# Patient Record
Sex: Male | Born: 1935 | Race: White | Hispanic: No | Marital: Married | Smoking: Former smoker
Health system: Southern US, Community
[De-identification: ages and names within clinical notes are randomized; demographics above are authoritative.]

## PROBLEM LIST (undated history)

## (undated) DIAGNOSIS — J439 Emphysema, unspecified: Secondary | ICD-10-CM

## (undated) DIAGNOSIS — A419 Sepsis, unspecified organism: Secondary | ICD-10-CM

## (undated) DIAGNOSIS — I4891 Unspecified atrial fibrillation: Secondary | ICD-10-CM

## (undated) DIAGNOSIS — N390 Urinary tract infection, site not specified: Secondary | ICD-10-CM

## (undated) DIAGNOSIS — C61 Malignant neoplasm of prostate: Secondary | ICD-10-CM

## (undated) HISTORY — DX: Urinary tract infection, site not specified: N39.0

## (undated) HISTORY — DX: Unspecified atrial fibrillation: I48.91

## (undated) HISTORY — DX: Urinary tract infection, site not specified: A41.9

## (undated) HISTORY — DX: Malignant neoplasm of prostate: C61

---

## 1999-04-18 HISTORY — PX: ABDOMINAL AORTIC ANEURYSM REPAIR: SUR1152

## 1999-04-18 HISTORY — PX: PROSTATECTOMY: SHX69

## 1999-11-17 ENCOUNTER — Encounter (INDEPENDENT_AMBULATORY_CARE_PROVIDER_SITE_OTHER): Payer: Self-pay | Admitting: Specialist

## 1999-11-17 ENCOUNTER — Other Ambulatory Visit: Admission: RE | Admit: 1999-11-17 | Discharge: 1999-11-17 | Payer: Self-pay | Admitting: Urology

## 1999-12-02 ENCOUNTER — Encounter: Admission: RE | Admit: 1999-12-02 | Discharge: 1999-12-02 | Payer: Self-pay | Admitting: Urology

## 1999-12-02 ENCOUNTER — Encounter: Payer: Self-pay | Admitting: Urology

## 2000-01-23 ENCOUNTER — Inpatient Hospital Stay (HOSPITAL_COMMUNITY): Admission: RE | Admit: 2000-01-23 | Discharge: 2000-01-26 | Payer: Self-pay | Admitting: Urology

## 2000-01-23 ENCOUNTER — Encounter (INDEPENDENT_AMBULATORY_CARE_PROVIDER_SITE_OTHER): Payer: Self-pay

## 2000-03-05 ENCOUNTER — Encounter: Admission: RE | Admit: 2000-03-05 | Discharge: 2000-03-05 | Payer: Self-pay | Admitting: Gastroenterology

## 2000-03-05 ENCOUNTER — Encounter: Payer: Self-pay | Admitting: *Deleted

## 2000-03-13 ENCOUNTER — Encounter: Payer: Self-pay | Admitting: *Deleted

## 2000-03-15 ENCOUNTER — Ambulatory Visit (HOSPITAL_COMMUNITY): Admission: RE | Admit: 2000-03-15 | Discharge: 2000-03-15 | Payer: Self-pay | Admitting: *Deleted

## 2000-04-18 ENCOUNTER — Encounter: Payer: Self-pay | Admitting: *Deleted

## 2000-04-20 ENCOUNTER — Encounter: Payer: Self-pay | Admitting: *Deleted

## 2000-04-20 ENCOUNTER — Inpatient Hospital Stay (HOSPITAL_COMMUNITY): Admission: RE | Admit: 2000-04-20 | Discharge: 2000-04-28 | Payer: Self-pay | Admitting: *Deleted

## 2000-04-20 ENCOUNTER — Encounter (INDEPENDENT_AMBULATORY_CARE_PROVIDER_SITE_OTHER): Payer: Self-pay | Admitting: Specialist

## 2002-04-14 ENCOUNTER — Encounter: Payer: Self-pay | Admitting: Surgery

## 2002-04-18 ENCOUNTER — Observation Stay (HOSPITAL_COMMUNITY): Admission: RE | Admit: 2002-04-18 | Discharge: 2002-04-19 | Payer: Self-pay | Admitting: Surgery

## 2003-02-09 ENCOUNTER — Encounter: Payer: Self-pay | Admitting: Gastroenterology

## 2003-02-09 ENCOUNTER — Ambulatory Visit (HOSPITAL_COMMUNITY): Admission: RE | Admit: 2003-02-09 | Discharge: 2003-02-09 | Payer: Self-pay | Admitting: Gastroenterology

## 2003-04-18 HISTORY — PX: COLECTOMY: SHX59

## 2003-04-18 HISTORY — PX: OTHER SURGICAL HISTORY: SHX169

## 2003-04-22 ENCOUNTER — Ambulatory Visit (HOSPITAL_COMMUNITY): Admission: RE | Admit: 2003-04-22 | Discharge: 2003-04-22 | Payer: Self-pay | Admitting: Surgery

## 2003-04-24 ENCOUNTER — Ambulatory Visit (HOSPITAL_COMMUNITY): Admission: RE | Admit: 2003-04-24 | Discharge: 2003-04-24 | Payer: Self-pay | Admitting: Surgery

## 2003-05-05 ENCOUNTER — Inpatient Hospital Stay (HOSPITAL_COMMUNITY): Admission: EM | Admit: 2003-05-05 | Discharge: 2003-05-19 | Payer: Self-pay | Admitting: Emergency Medicine

## 2003-05-07 ENCOUNTER — Encounter (INDEPENDENT_AMBULATORY_CARE_PROVIDER_SITE_OTHER): Payer: Self-pay | Admitting: *Deleted

## 2003-11-16 ENCOUNTER — Encounter: Admission: RE | Admit: 2003-11-16 | Discharge: 2003-11-16 | Payer: Self-pay | Admitting: General Surgery

## 2004-01-20 ENCOUNTER — Inpatient Hospital Stay (HOSPITAL_COMMUNITY): Admission: RE | Admit: 2004-01-20 | Discharge: 2004-01-27 | Payer: Self-pay | Admitting: General Surgery

## 2004-08-03 ENCOUNTER — Emergency Department (HOSPITAL_COMMUNITY): Admission: EM | Admit: 2004-08-03 | Discharge: 2004-08-04 | Payer: Self-pay | Admitting: Emergency Medicine

## 2007-01-03 ENCOUNTER — Ambulatory Visit: Payer: Self-pay | Admitting: Internal Medicine

## 2007-01-16 ENCOUNTER — Ambulatory Visit: Payer: Self-pay | Admitting: Internal Medicine

## 2007-01-16 ENCOUNTER — Encounter: Payer: Self-pay | Admitting: Internal Medicine

## 2007-08-28 ENCOUNTER — Telehealth: Payer: Self-pay | Admitting: Internal Medicine

## 2007-08-28 ENCOUNTER — Emergency Department (HOSPITAL_COMMUNITY): Admission: EM | Admit: 2007-08-28 | Discharge: 2007-08-28 | Payer: Self-pay | Admitting: Emergency Medicine

## 2008-11-12 ENCOUNTER — Encounter: Payer: Self-pay | Admitting: Internal Medicine

## 2009-10-06 ENCOUNTER — Ambulatory Visit: Payer: Self-pay | Admitting: Internal Medicine

## 2009-10-06 DIAGNOSIS — M25429 Effusion, unspecified elbow: Secondary | ICD-10-CM | POA: Insufficient documentation

## 2009-10-06 DIAGNOSIS — S59909A Unspecified injury of unspecified elbow, initial encounter: Secondary | ICD-10-CM

## 2009-10-06 DIAGNOSIS — S6990XA Unspecified injury of unspecified wrist, hand and finger(s), initial encounter: Secondary | ICD-10-CM

## 2009-10-06 DIAGNOSIS — S59919A Unspecified injury of unspecified forearm, initial encounter: Secondary | ICD-10-CM

## 2009-10-07 LAB — CONVERTED CEMR LAB
Basophils Relative: 0.5 % (ref 0.0–3.0)
Eosinophils Absolute: 0 10*3/uL (ref 0.0–0.7)
Eosinophils Relative: 0.5 % (ref 0.0–5.0)
Lymphocytes Relative: 22.3 % (ref 12.0–46.0)
Monocytes Relative: 11.8 % (ref 3.0–12.0)
Neutrophils Relative %: 64.9 % (ref 43.0–77.0)
RBC: 4.32 M/uL (ref 4.22–5.81)
WBC: 5.8 10*3/uL (ref 4.5–10.5)

## 2009-12-17 ENCOUNTER — Encounter: Payer: Self-pay | Admitting: Internal Medicine

## 2009-12-24 ENCOUNTER — Encounter: Payer: Self-pay | Admitting: Internal Medicine

## 2010-01-12 ENCOUNTER — Encounter: Payer: Self-pay | Admitting: Internal Medicine

## 2010-02-09 ENCOUNTER — Encounter (INDEPENDENT_AMBULATORY_CARE_PROVIDER_SITE_OTHER): Payer: Self-pay | Admitting: *Deleted

## 2010-02-11 ENCOUNTER — Ambulatory Visit: Payer: Self-pay | Admitting: Internal Medicine

## 2010-02-25 ENCOUNTER — Ambulatory Visit: Payer: Self-pay | Admitting: Internal Medicine

## 2010-02-28 ENCOUNTER — Encounter: Payer: Self-pay | Admitting: Internal Medicine

## 2010-05-17 NOTE — Assessment & Plan Note (Signed)
Summary: cut on arm/infection//kn   Vital Signs:  Patient profile:   77 year old male Weight:      170.0 pounds Temp:     98.4 degrees F oral Pulse rate:   64 / minute Resp:     17 per minute BP sitting:   110 / 70  (left arm) Cuff size:   large  Vitals Entered By: Shonna Chock (October 06, 2009 12:27 PM) CC: Left arm (mainly elbow area) swollen x 1.5 week (s) and tender to touch Comments REVIEWED MED LIST, PATIENT AGREED DOSE AND INSTRUCTION CORRECT    CC:  Left arm (mainly elbow area) swollen x 1.5 week (s) and tender to touch.  History of Present Illness: On 09/27/2009 he noted  puncture wound @ R elbow with bleeding  after cutting down holly bush. Swelling & pain beginning 2 days later.The wound has healed , but "my wife feels it is infected". Rx: none. Tetanus status ? Marland KitchenPMH of OA; no gout  Allergies (verified): No Known Drug Allergies  Review of Systems General:  Denies chills, fever, and sweats. MS:  Denies joint pain. Derm:  Complains of changes in color of skin; elbow "? slightly red & warm @ times".  Physical Exam  General:  in no acute distress; alert,appropriate and cooperative throughout examination Extremities:  Olecranon effusion R elbow with minimal tenderness to pressure Skin:  No wound, erythema, or temp change @ elbow Cervical Nodes:  No lymphadenopathy noted Axillary Nodes:  No palpable lymphadenopathy; no epitrochlear   Impression & Recommendations:  Problem # 1:  ELBOW INJURY (ICD-959.3)  S/P puncture wound, healed. No cellulitis or osteomyelitis clinically  Orders: Venipuncture ([address]) TLB-CBC Platelet - w/Differential (85025-CBCD) T-Elbow Rigt 2 View (73070TC) TLB-Sedimentation Rate (ESR) (85652-ESR)  Problem # 2:  EFFUSION OF UPPER ARM JOINT (ICD-719.02)  ? post traumatic  Orders: Venipuncture ([address]) TLB-CBC Platelet - w/Differential (85025-CBCD) T-Elbow Rigt 2 View (73070TC) TLB-Sedimentation Rate (ESR) (85652-ESR)  Complete  Medication List: 1)  Prilosec Otc 20 Mg Tbec (Omeprazole magnesium) .Marland Kitchen.. 1 by mouth once daily 2)  Stool Softner  .... Daily  Other Orders: Tdap => 50yrs IM ([address]) Admin 1st Vaccine (91478)  Patient Instructions: 1)  Wear  elastic  elbow brace daily until fluid gone. Orthopedic  consult for drainage  if fluid fails to resolve   Immunizations Administered:  Tetanus Vaccine:    Vaccine Type: Tdap    Site: left deltoid    Mfr: GlaxoSmithKline    Dose: 0.5 ml    Route: IM    Given by: Army Fossa CMA    Exp. Date: 07/10/2011    Lot #: [id number]

## 2010-05-17 NOTE — Letter (Signed)
Summary: [facility]  [facility]   Imported By: Lanelle Bal 01/05/2010 10:20:11  _____________________________________________________________________  External Attachment:    Type:   Image     Comment:   External Document

## 2010-05-17 NOTE — Miscellaneous (Signed)
Summary: [facility] previsit  Clinical Lists Changes  Medications: Added new medication of MOVIPREP 100 GM  SOLR (PEG-KCL-NACL-NASULF-NA ASC-C) As per prep instructions. - Signed Rx of MOVIPREP 100 GM  SOLR (PEG-KCL-NACL-NASULF-NA ASC-C) As per prep instructions.;  #1 x 0;  Signed;  Entered by: Karl Bales RN;  Authorized by: Hilarie Fredrickson MD;  Method used: Electronically to [facility] # [id number]*, [address] [place], [place], [place]  [address], Ph: [phone] or [phone], Fax: [phone] Observations: Added new observation of NKA: T (02/11/2010 9:38)    Prescriptions: MOVIPREP 100 GM  SOLR (PEG-KCL-NACL-NASULF-NA ASC-C) As per prep instructions.  #1 x 0   Entered by:   Karl Bales RN   Authorized by:   Hilarie Fredrickson MD   Signed by:   Karl Bales RN on 02/11/2010   Method used:   Electronically to        [facility] [id number]* (retail)       [address]       [place], [place]  [address]       Ph: [phone] or [phone]       Fax: [phone]   RxID:   [id number]

## 2010-05-17 NOTE — Procedures (Signed)
Summary: Colonoscopy  Patient: [name] Note: All result statuses are Final unless otherwise noted.  Tests: (1) Colonoscopy (COL)   COL Colonoscopy           DONE     [facility]     [address]     [place], [place]  [address]           COLONOSCOPY PROCEDURE REPORT           PATIENT:  Derrick [name], Derrick [name]  MR#:  [id number]     BIRTHDATE:  06-08-1928, 81 yrs. old  GENDER:  male     ENDOSCOPIST:  Wilhemina Bonito. Eda Keys, MD     REF. BY:  Surveillance Program Recall,     PROCEDURE DATE:  02/25/2010     PROCEDURE:  Colonoscopy with snare polypectomy X 4     ASA CLASS:  Class II     INDICATIONS:  history of pre-cancerous (adenomatous) colon polyps,     surveillance and high-risk screening ; HX MULTIPLE ADENOMAS. LAST     EXAM W/ SAME 01-2007     MEDICATIONS:   Fentanyl 50 mcg IV, Versed 6 mg IV           DESCRIPTION OF PROCEDURE:   After the risks benefits and     alternatives of the procedure were thoroughly explained, informed     consent was obtained.  Digital rectal exam was performed and     revealed no abnormalities.   The LB 180AL [id number] endoscope was     introduced through the anus and advanced to the cecum, which was     identified by both the appendix and ileocecal valve, without     limitations.Time to cecum = 2:00 min.  The quality of the prep was     good, using MoviPrep.  The instrument was then slowly withdrawn     (time = 13:13 min) as the colon was fully examined.     <<PROCEDUREIMAGES>>           FINDINGS:  There were four, all < 5mm, polyps identified and     removed from the ascending colon (2) and transverse colon (2).     Polyps were snared without cautery. Retrieval was successful.     Moderate diverticulosis was found throughout the colon.     Retroflexed views in the rectum revealed internal hemorrhoids.     The scope was then withdrawn from the patient and the procedure     completed.           COMPLICATIONS:  None     ENDOSCOPIC IMPRESSION:   1) Polyps, multiple - removed     2) Moderate diverticulosis throughout the colon     3) Internal hemorrhoids     RECOMMENDATIONS:     1) Follow up colonoscopy in 3 years if medically fit     ______________________________     Wilhemina Bonito. Eda Keys, MD           CC:  Pecola Lawless, MD;The Patient           n.     Rosalie DoctorWilhemina Bonito. Eda Keys at 02/25/2010 11:56 AM           Cohron, Leonette Most, [id number]  Note: An exclamation mark (!) indicates a result that was not dispersed into the flowsheet. Document Creation Date: 02/25/2010 11:56 AM _______________________________________________________________________  (1) Order result status: Final Collection or observation date-time: 02/25/2010 11:45 Requested date-time:  Receipt date-time:  Reported date-time:  Referring Physician:   Ordering Physician: Fransico Setters [phone]) Specimen Source:  Source: Launa Grill Order Number: [id number] Lab site:   Appended Document: Colonoscopy     Procedures Next Due Date:    Colonoscopy: 02/2013

## 2010-05-17 NOTE — Letter (Signed)
Summary: [facility] Instructions  [facility]  [address] [place], [place] [address]   Phone: [phone]  Fax: [phone]       Derrick [name]    1928-09-16    MRN: [id number]        Procedure Day [name]:  [name]  02/25/10     Arrival Time:   10:00AM     Procedure Time:  11:00AM     Location of Procedure:                    [name] _  [facility] (4th Floor)                       PREPARATION FOR COLONOSCOPY WITH MOVIPREP   Starting 5 days prior to your procedure 02/20/10 do not eat nuts, seeds, popcorn, corn, beans, peas,  salads, or any raw vegetables.  Do not take any fiber supplements (e.g. Metamucil, Citrucel, and Benefiber).  THE DAY BEFORE YOUR PROCEDURE         DATE: 02/24/10   DAY: THURSDAY  1.  Drink clear liquids the entire day-NO SOLID FOOD  2.  Do not drink anything colored red or purple.  Avoid juices with pulp.  No orange juice.  3.  Drink at least 64 oz. (8 glasses) of fluid/clear liquids during the day to prevent dehydration and help the prep work efficiently.  CLEAR LIQUIDS INCLUDE: Water Jello Ice Popsicles Tea (sugar ok, no milk/cream) Powdered fruit flavored drinks Coffee (sugar ok, no milk/cream) Gatorade Juice: apple, white grape, white cranberry  Lemonade Clear bullion, consomm, broth Carbonated beverages (any kind) Strained chicken noodle soup Hard Candy                             4.  In the morning, mix first dose of MoviPrep solution:    Empty 1 Pouch A and 1 Pouch B into the disposable container    Add lukewarm drinking water to the top line of the container. Mix to dissolve    Refrigerate (mixed solution should be used within 24 hrs)  5.  Begin drinking the prep at 5:00 p.m. The MoviPrep container is divided by 4 marks.   Every 15 minutes drink the solution down to the next mark (approximately 8 oz) until the full liter is complete.   6.  Follow completed prep with 16 oz of clear liquid of your choice  (Nothing red or purple).  Continue to drink clear liquids until bedtime.  7.  Before going to bed, mix second dose of MoviPrep solution:    Empty 1 Pouch A and 1 Pouch B into the disposable container    Add lukewarm drinking water to the top line of the container. Mix to dissolve    Refrigerate  THE DAY OF YOUR PROCEDURE      DATE:  02/25/10    DAY:   FRIDAY   Beginning at 6:00AM (5 hours before procedure):         1. Every 15 minutes, drink the solution down to the next mark (approx 8 oz) until the full liter is complete.  2. Follow completed prep with 16 oz. of clear liquid of your choice.    3. You may drink clear liquids until 9:00AM (2 HOURS BEFORE PROCEDURE).   MEDICATION INSTRUCTIONS  Unless otherwise instructed, you should take regular prescription medications with a small sip of water  as early as possible the morning of your procedure.         OTHER INSTRUCTIONS  You will need a responsible adult at least 75 years of age to accompany you and drive you home.   This person must remain in the waiting room during your procedure.  Wear loose fitting clothing that is easily removed.  Leave jewelry and other valuables at home.  However, you may wish to bring a book to read or  an iPod/MP3 player to listen to music as you wait for your procedure to start.  Remove all body piercing jewelry and leave at home.  Total time from sign-in until discharge is approximately 2-3 hours.  You should go home directly after your procedure and rest.  You can resume normal activities the  day after your procedure.  The day of your procedure you should not:   Drive   Make legal decisions   Operate machinery   Drink alcohol   Return to work  You will receive specific instructions about eating, activities and medications before you leave.    The above instructions have been reviewed and explained to me by   Karl Bales RN  February 11, 2010 10:18 AM    I fully  understand and can verbalize these instructions _____________________________ Date _________

## 2010-05-17 NOTE — Letter (Signed)
Summary: Patient Notice- Polyp Results  [facility]  [address] [place], [place] [address]   Phone: [phone]  Fax: [phone]        February 28, 2010 MRN: [id number]    Derrick [name] [address] [place], [place]  [address]    Dear [name],  I am pleased to inform you that the colon polyp(s) removed during your recent colonoscopy was (were) found to be benign (no cancer detected) upon pathologic examination.  I recommend you have a repeat colonoscopy examination in 3 years to look for recurrent polyps, as having colon polyps increases your risk for having recurrent polyps or even colon cancer in the future.  Should you develop new or worsening symptoms of abdominal pain, bowel habit changes or bleeding from the rectum or bowels, please schedule an evaluation with either your primary care physician or with me.  Additional information/recommendations:  __ No further action with gastroenterology is needed at this time. Please      follow-up with your primary care physician for your other healthcare      needs.    Please call us if you are having persistent problems or have questions about your condition that have not been fully answered at this time.  Sincerely,  Hilarie Fredrickson MD  This letter has been electronically signed by your physician.  Appended Document: Patient Notice- Polyp Results Letter mailed

## 2010-05-17 NOTE — Letter (Signed)
Summary: Colonoscopy Letter  [facility]  [address] [place], [place] [address]   Phone: [phone]  Fax: [phone]      December 17, 2009 MRN: [id number]   Derrick [name] [address] [place], [place]  [address]   Dear [name],   According to your medical record, it is time for you to schedule a Colonoscopy. The American Cancer Society recommends this procedure as a method to detect early colon cancer. Patients with a family history of colon cancer, or a personal history of colon polyps or inflammatory bowel disease are at increased risk.  This letter has been generated based on the recommendations made at the time of your procedure. If you feel that in your particular situation this may no longer apply, please contact our office.  Please call our office at [phone] to schedule this appointment or to update your records at your earliest convenience.  Thank you for cooperating with [place] to provide you with the very best care possible.   Sincerely,  Wilhemina Bonito. Marina Goodell, M.D.  [facility] Gastroenterology Division [phone]

## 2010-05-17 NOTE — Letter (Signed)
Summary: Pre Visit Letter Revised  [facility]  [address] [place], [place] [address]   Phone: [phone]  Fax: [phone]        01/12/2010 MRN: [id number] Derrick [name] [address] [place], [place]  [address]             Procedure Date:  11-11 at 11am  Welcome to the Gastroenterology Division at [facility].    You are scheduled to see a nurse for your pre-procedure visit on 02-11-10 at 10am on the 3rd floor at [facility] [address]  We ask that you try to arrive at our office 15 minutes prior to your appointment time to allow for check-in.  Please take a minute to review the attached form.  If you answer "Yes" to one or more of the questions on the first page, we ask that you call the person listed at your earliest opportunity.  If you answer "No" to all of the questions, please complete the rest of the form and bring it to your appointment.    Your nurse visit will consist of discussing your medical and surgical history, your immediate family medical history, and your medications.   If you are unable to list all of your medications on the form, please bring the medication bottles to your appointment and we will list them.  We will need to be aware of both prescribed and over the counter drugs.  We will need to know exact dosage information as well.    Please be prepared to read and sign documents such as consent forms, a financial agreement, and acknowledgement forms.  If necessary, and with your consent, a friend or relative is welcome to sit-in on the nurse visit with you.  Please bring your insurance card so that we may make a copy of it.  If your insurance requires a referral to see a specialist, please bring your referral form from your primary care physician.  No co-pay is required for this nurse visit.     If you cannot keep your appointment, please call [phone] to cancel or reschedule prior to your appointment date.  This  allows [place] the opportunity to schedule an appointment for another patient in need of care.    Thank you for choosing [facility] for your medical needs.  We appreciate the opportunity to care for you.  Please visit [place] at our website  to learn more about our practice.  Sincerely, The Gastroenterology Division

## 2010-07-25 ENCOUNTER — Emergency Department (HOSPITAL_COMMUNITY): Payer: Medicare Other

## 2010-07-25 ENCOUNTER — Emergency Department (HOSPITAL_COMMUNITY)
Admission: EM | Admit: 2010-07-25 | Discharge: 2010-07-26 | Disposition: A | Discharge status: Home / Self Care | Payer: Medicare Other | Attending: Emergency Medicine | Admitting: Emergency Medicine

## 2010-07-25 DIAGNOSIS — N289 Disorder of kidney and ureter, unspecified: Secondary | ICD-10-CM | POA: Insufficient documentation

## 2010-07-25 DIAGNOSIS — N12 Tubulo-interstitial nephritis, not specified as acute or chronic: Secondary | ICD-10-CM | POA: Insufficient documentation

## 2010-07-25 DIAGNOSIS — Z8546 Personal history of malignant neoplasm of prostate: Secondary | ICD-10-CM | POA: Insufficient documentation

## 2010-07-25 DIAGNOSIS — J449 Chronic obstructive pulmonary disease, unspecified: Secondary | ICD-10-CM | POA: Insufficient documentation

## 2010-07-25 DIAGNOSIS — Z9889 Other specified postprocedural states: Secondary | ICD-10-CM | POA: Insufficient documentation

## 2010-07-25 DIAGNOSIS — J4489 Other specified chronic obstructive pulmonary disease: Secondary | ICD-10-CM | POA: Insufficient documentation

## 2010-07-25 DIAGNOSIS — K5900 Constipation, unspecified: Secondary | ICD-10-CM | POA: Insufficient documentation

## 2010-07-25 DIAGNOSIS — Q619 Cystic kidney disease, unspecified: Secondary | ICD-10-CM | POA: Insufficient documentation

## 2010-07-25 DIAGNOSIS — R110 Nausea: Secondary | ICD-10-CM | POA: Insufficient documentation

## 2010-07-25 DIAGNOSIS — R630 Anorexia: Secondary | ICD-10-CM | POA: Insufficient documentation

## 2010-07-25 DIAGNOSIS — R509 Fever, unspecified: Secondary | ICD-10-CM | POA: Insufficient documentation

## 2010-07-25 DIAGNOSIS — R1032 Left lower quadrant pain: Secondary | ICD-10-CM | POA: Insufficient documentation

## 2010-07-25 DIAGNOSIS — R10814 Left lower quadrant abdominal tenderness: Secondary | ICD-10-CM | POA: Insufficient documentation

## 2010-07-25 DIAGNOSIS — I739 Peripheral vascular disease, unspecified: Secondary | ICD-10-CM | POA: Insufficient documentation

## 2010-07-25 LAB — CBC
MCH: 34 pg (ref 26.0–34.0)
MCV: 98.9 fL (ref 78.0–100.0)
Platelets: 114 10*3/uL — ABNORMAL LOW (ref 150–400)
RDW: 12.7 % (ref 11.5–15.5)
WBC: 10 10*3/uL (ref 4.0–10.5)

## 2010-07-25 LAB — COMPREHENSIVE METABOLIC PANEL
Albumin: 4.1 g/dL (ref 3.5–5.2)
BUN: 20 mg/dL (ref 6–23)
Calcium: 9.9 mg/dL (ref 8.4–10.5)
Creatinine, Ser: 2.1 mg/dL — ABNORMAL HIGH (ref 0.4–1.5)
Potassium: 4.5 mEq/L (ref 3.5–5.1)
Total Protein: 7.4 g/dL (ref 6.0–8.3)

## 2010-07-25 LAB — DIFFERENTIAL
Eosinophils Absolute: 0 10*3/uL (ref 0.0–0.7)
Eosinophils Relative: 0 % (ref 0–5)
Lymphs Abs: 0.4 10*3/uL — ABNORMAL LOW (ref 0.7–4.0)

## 2010-07-25 LAB — URINALYSIS, ROUTINE W REFLEX MICROSCOPIC
Glucose, UA: NEGATIVE mg/dL
Protein, ur: >300 mg/dL — AB
Specific Gravity, Urine: 1.018 (ref 1.005–1.030)
pH: 6 (ref 5.0–8.0)

## 2010-07-25 LAB — URINE MICROSCOPIC-ADD ON

## 2010-07-25 MED ORDER — IOHEXOL 300 MG/ML  SOLN
50.0000 mL | Freq: Once | INTRAMUSCULAR | Status: AC | PRN
  Administered 2010-07-25: 50 mL via INTRAVENOUS

## 2010-07-27 LAB — URINE CULTURE: Colony Count: >=100000

## 2010-08-15 ENCOUNTER — Ambulatory Visit (HOSPITAL_BASED_OUTPATIENT_CLINIC_OR_DEPARTMENT_OTHER)
Admission: RE | Admit: 2010-08-15 | Discharge: 2010-08-15 | Disposition: A | Discharge status: Home / Self Care | Payer: Medicare Other | Source: Ambulatory Visit | Attending: Urology | Admitting: Urology

## 2010-08-15 ENCOUNTER — Other Ambulatory Visit: Payer: Self-pay | Admitting: Urology

## 2010-08-15 DIAGNOSIS — Z01810 Encounter for preprocedural cardiovascular examination: Secondary | ICD-10-CM | POA: Insufficient documentation

## 2010-08-15 DIAGNOSIS — I739 Peripheral vascular disease, unspecified: Secondary | ICD-10-CM | POA: Insufficient documentation

## 2010-08-15 DIAGNOSIS — Z01812 Encounter for preprocedural laboratory examination: Secondary | ICD-10-CM | POA: Insufficient documentation

## 2010-08-15 DIAGNOSIS — K219 Gastro-esophageal reflux disease without esophagitis: Secondary | ICD-10-CM | POA: Insufficient documentation

## 2010-08-15 DIAGNOSIS — N289 Disorder of kidney and ureter, unspecified: Secondary | ICD-10-CM | POA: Insufficient documentation

## 2010-08-15 DIAGNOSIS — J438 Other emphysema: Secondary | ICD-10-CM | POA: Insufficient documentation

## 2010-08-15 DIAGNOSIS — Z8546 Personal history of malignant neoplasm of prostate: Secondary | ICD-10-CM | POA: Insufficient documentation

## 2010-08-15 DIAGNOSIS — N189 Chronic kidney disease, unspecified: Secondary | ICD-10-CM | POA: Insufficient documentation

## 2010-08-15 DIAGNOSIS — N135 Crossing vessel and stricture of ureter without hydronephrosis: Secondary | ICD-10-CM | POA: Insufficient documentation

## 2010-08-15 DIAGNOSIS — N1330 Unspecified hydronephrosis: Secondary | ICD-10-CM | POA: Insufficient documentation

## 2010-08-15 NOTE — Op Note (Signed)
NAMEJOHNEDWARD, BRODRICK NO.:  [id number]  MEDICAL RECORD NO.:  [id number]           PATIENT TYPE:  E  LOCATION:  [facility]                         FACILITY:  [facility]  PHYSICIAN:  Valetta Fuller, M.D.  DATE OF BIRTH:  1929-04-01  DATE OF PROCEDURE: DATE OF DISCHARGE:  07/26/2010                              OPERATIVE REPORT   PREOPERATIVE DIAGNOSES: 1. A 2.5 centimeter filling defect left renal pelvis, possible     transitional cell carcinoma. 2. History of adenocarcinoma of the prostate. 3. Mild hydronephrosis.  POSTOPERATIVE DIAGNOSES: 1. A 2.5 centimeter filling defect left renal pelvis, possible     transitional cell carcinoma. 2. History of adenocarcinoma of the prostate. 3. Mild hydronephrosis.  PROCEDURE PERFORMED:  Cystoscopy, left retrograde pyelogram, left ureteral balloon dilation, left ureteroscopy, left renal pelvic washings for cytology, left double-J stent placement.  SURGEON:  Valetta Fuller, M.D.  ANESTHESIA:  General.  INDICATIONS:  Mr. Honor is 77 years of age.  He is 10 years status post radical retropubic prostatectomy for prostate cancer and is currently without evidence of disease.  The patient had presented to the emergency room with some left lower quadrant abdominal pain.  Urinalysis was suspicious for cystitis and urine culture grew Escherichia coli.  As part of his assessment for his abdominal pain, he had a CT of his abdomen and pelvis.  On delayed renal imaging, there was an incompletely characterized filling defect/mass in the left renal pelvis measuring 2.3 x 1.4 cm.  Radiographically this was concerning for transitional cell carcinoma.  The patient came to our office to discuss things.  We felt that he needed to have an additional assessment to determine the etiology of this filling defect.  The patient does have chronic renal insufficiency with a creatinine in the 2.0-2.1 range.  Full informed consent has been  obtained.  TECHNIQUE AND FINDINGS:  The patient was brought to the operating room. He had successful induction of general anesthesia.  PAS compression boots were placed and he received ciprofloxacin.  He was placed in the lithotomy position and prepped and draped in the usual manner.  Anterior urethra was unremarkable.  Prostatic urethra was absent.  The bladder itself was endoscopically unremarkable.  Left retrograde pyelogram was done by engaging a 5-French open-ended catheter in the left ureter.  The entire ureter was slightly tortuous, especially in the pelvic area where it did take a sudden deviation laterally.  There were no gross filling defects.  There did appear to be some degree of underlying UPJ obstruction with a jet effective contrast and a dilated renal pelvis and slightly dilated caliceal system.  There did not appear to be high-grade obstruction.  With fluoroscopic interpretation, I could not see a definitive filling defect in the left renal pelvis corresponding to the images on CT, which I had reviewed just prior to the case.  It was difficult to advance the open-ended catheter.  There were several areas of substantial narrowing, especially where the ureter did go medially and then fairly acutely laterally.  The guidewire was, however, placed up to the left renal pelvis.  We made an  initial attempt to place the access sheath, but even utilizing the inside portion I could not safely advance it into the ureter much beyond the most distal 4 to 5 cm.  For that reason, over the guidewire we did place a ureteral dilating balloon which was dilated in the standard manner to 12 atmospheres for 5 minutes.  After that we were still unable to successfully place the access sheath due to both some mild tortuosity as well as just general tightness/narrowing of the ureter.  Over the guidewire I was able to get the flexible scope into the ureter but not all the way up to the renal  pelvis.  What we could see of the ureter did not show any signs of transitional cell carcinoma, but again the left renal pelvis was not visualized.  Over the guidewire I was able to place a 5-French open-ended catheter and then performed saline barbotage of the left renal pelvis with at least 30 to 40 cc of lightly bloody fluid obtainable for cytologic analysis.  We elected to abandon attempts at ureteroscopy with consideration of stent placement with hopefully a dilation and a repeat attempt in 2 to 3 weeks.  With the guidewire still in good positioning, the cystoscope was reinserted.  Over the cystoscope a 6-French 24-cm double-J stent was placed.  Again it was quite snug, but we were able to get this into the renal pelvis with both fluoroscopic as well as visual guidance and good positioning was confirmed.  The patient was brought to the recovery room in stable condition.     Valetta Fuller, M.D.     DSG/MEDQ  D:  08/15/2010  T:  08/15/2010  Job:  [id number]  Electronically Signed by Barron Alvine M.D. on 08/15/2010 12:10:26 PM

## 2010-08-29 ENCOUNTER — Ambulatory Visit (HOSPITAL_BASED_OUTPATIENT_CLINIC_OR_DEPARTMENT_OTHER)
Admission: RE | Admit: 2010-08-29 | Discharge: 2010-08-29 | Disposition: A | Discharge status: Home / Self Care | Payer: Medicare Other | Source: Ambulatory Visit | Attending: Urology | Admitting: Urology

## 2010-08-29 DIAGNOSIS — J449 Chronic obstructive pulmonary disease, unspecified: Secondary | ICD-10-CM | POA: Insufficient documentation

## 2010-08-29 DIAGNOSIS — R310 Gross hematuria: Secondary | ICD-10-CM | POA: Insufficient documentation

## 2010-08-29 DIAGNOSIS — J4489 Other specified chronic obstructive pulmonary disease: Secondary | ICD-10-CM | POA: Insufficient documentation

## 2010-08-29 DIAGNOSIS — N1330 Unspecified hydronephrosis: Secondary | ICD-10-CM | POA: Insufficient documentation

## 2010-08-29 DIAGNOSIS — Z01812 Encounter for preprocedural laboratory examination: Secondary | ICD-10-CM | POA: Insufficient documentation

## 2010-08-29 DIAGNOSIS — Z466 Encounter for fitting and adjustment of urinary device: Secondary | ICD-10-CM | POA: Insufficient documentation

## 2010-08-29 DIAGNOSIS — Z0389 Encounter for observation for other suspected diseases and conditions ruled out: Secondary | ICD-10-CM | POA: Insufficient documentation

## 2010-08-29 LAB — POCT HEMOGLOBIN-HEMACUE: Hemoglobin: 15 g/dL (ref 13.0–17.0)

## 2010-08-30 NOTE — Op Note (Signed)
[name], [name]:  [id number]  MEDICAL RECORD NO.:  [id number]           PATIENT TYPE:  LOCATION:                                 FACILITY:  PHYSICIAN:  Valetta Fuller, M.D.  DATE OF BIRTH:  06-Apr-1929  DATE OF PROCEDURE: DATE OF DISCHARGE:                              OPERATIVE REPORT   PREOPERATIVE DIAGNOSES: 1. Questionable mass, left renal pelvis. 2. Gross hematuria. 3. Mild hydronephrosis. 4. Indwelling left double-J stent.  POSTOPERATIVE DIAGNOSES: 1. No tumor seen. 2. Gross hematuria. 3. Mild hydronephrosis. 4. Indwelling left double-J stent.  PROCEDURE PERFORMED:  Cystoscopy, removal of left double-J stent, left retrograde pyelogram with fluoroscopic interpretation, digital flexible left ureteroscopy.  SURGEON:  Valetta Fuller, MD  ANESTHESIA:  General.  INDICATIONS:  Mr. Derrick [name] is 77 years of age.  Several weeks ago, the patient had presented to the emergency room with some abdominal complaints.  He had evidence of a urinary tract infection at that time. He had a CT scan which on delayed imaging suggested a possible filling defect in his left renal pelvis, potentially concerning for transitional cell carcinoma of the bladder.  Approximately 2 weeks ago, the patient was taken to surgery.  At this time on retrograde pyelogram, we did not see an obvious filling defect.  At that time, ureteroscopy attempts were unsuccessful due to inability to really access his proximal ureter and renal pelvis.  For that reason, a cytologic analysis was done which did show some atypical cells but there was not a definitive diagnosis of transitional cell carcinoma.  We placed a double-J stent in hopes of going back after some natural ureteral dilation for reassessment.  He presents now for that.  Rationale explained to the patient.  Full informed consent obtained.  TECHNIQUE AND FINDINGS:  The patient was brought to the operating room. He received  perioperative ciprofloxacin and PAS compression boots. Appropriate surgical time-out was performed.  The patient had successful induction of general anesthesia and was placed in lithotomy position. Cystoscopy showed no changes from his last assessment.  Left double-J stent seen in good position.  This was partially extracted and a guidewire placed to the left renal pelvis.  Over the guidewire, we placed an open-ended catheter and then performed retrograde pyelogram with fluoroscopic interpretation.  Again, no definitive filling defects could be appreciated anywhere in the collecting system.  There was some natural dilation from the stent.  An access sheath was then placed over the guidewire.  The digital flexible ureteroscope was then inserted.  We then carefully assessed the proximal ureter, renal pelvis, and each caliceal system individually without finding any significant pathology. Repeat contrast was injected through the flexible ureteroscope and again no definitive filling defect was appreciated.  I reassessed the renal pelvis and again no obvious tumor noted.  We elected to remove his guidewire and not replace the double-J stent.  The patient appeared to tolerate the procedure well and did not have any obvious complications and was brought to recovery room in stable condition.     Valetta Fuller, M.D.     DSG/MEDQ  D:  08/29/2010  T:  08/29/2010  Job:  [id number]  Electronically Signed by Barron Alvine M.D. on 08/30/2010 07:02:09 PM

## 2010-09-02 NOTE — Procedures (Signed)
[place]  Patient:    Derrick [name], Derrick [name]                     MRN: [id number] Proc. Date: 03/15/00 Adm. Date:  [id number] Disc. Date: [id number] Attending:  Melvenia Needles CC:         Titus Dubin. Alwyn Ren, M.D. [facility]  Barron Alvine, M.D.  Peripheral Vascular Catheterization Lab   Procedure Report  DIAGNOSIS:  Abdominal aortic aneurysm.  PROCEDURE:  Abdominal aortogram with bilateral pelvic arteriogram.  CONTRAST:  Visipaque, 120 ml.  ACCESS:  Right common femoral artery, 5-French sheath.  COMPLICATIONS:  None known.  CLINICAL NOTE:  This is a 77 year old male who is status post prostatectomy for prostate cancer.  He is known to have a 6-cm abdominal aortic aneurysm.  He has undergone CT scan verifying this and is brought to the catheterization lab at this time for diagnostic arteriography.  PROCEDURE NOTE:  The patient was brought to the [facility] Peripheral Vascular Catheterization Lab in stable condition.  Informed consent was obtained. Placed in the supine position.  Both groins were prepped and draped in a sterile fashion.  The skin and subcutaneous tissue in the right groin was instilled with 1% Xylocaine.  The patient was administered 2 mg of Versed, 2 mg of Nubain intravenously. DD:  04/04/00 TD:  04/05/00 Job: [id number]

## 2010-09-02 NOTE — Op Note (Signed)
[facility]  Patient:    Derrick [name], Derrick [name]                     MRN: [id number] Proc. Date: 01/23/00 Adm. Date:  [id number] Attending:  Thermon Leyland CC:         Titus Dubin. Alwyn Ren, M.D. [facility] S. Edilia Bo, M.D.   Operative Report  PREOPERATIVE DIAGNOSIS:  Stage T1C adenocarcinoma of the prostate.  POSTOPERATIVE DIAGNOSIS:  Stage T1C adenocarcinoma of the prostate.  PROCEDURE PERFORMED:  Pelvic lymph node dissection, radical retropubic prostatectomy.  SURGEON:  Barron Alvine, M.D.  ASSISTANT:  Lindaann Slough, M.D.  ANESTHESIA:  General endotracheal.  INDICATIONS:  The patient is a 77 year old male.  He has been recently diagnosed with biopsy-proven adenocarcinoma of the prostate.  His pretreatment PSA was in the 6-7 range with a reduced PSA-2 ratio.  The biopsy however did reveal poorly differentiated carcinoma with a Gleason 4+4 equals 8.  We also knew that his testosterone level was markedly reduced preoperatively and we felt that that potentially indicated a chance for more advanced disease than would be otherwise anticipated based on his PSA of under 10.  A bone scan however was negative as was the CT scan.  He does have an incidentally noted 5 cm aneurysm of the abdominal aorta which has been evaluated by Dr. Edilia Bo and surgical intervention will probably be considered after he recovers from his prostate surgery depending on the outcomes.  The patient underwent extensive counseling with regard to treatment options for his disease, and elected to have radical retropubic prostatectomy.  He received preoperative clearance and was evaluated preoperatively by a cardiologist.  DESCRIPTION OF PROCEDURE AND FINDINGS:  The patient was brought to the operating room where he had successful induction of general anesthesia.  He was placed in the supine position, prepped and draped in the usual manner.  A Foley catheter was placed  sterilely.  A lower midline incision was performed. The retropubic space was entered.  Palpably, there was no obvious disease outside the prostate.  A self-retaining [employer] retractor was utilized and the retropubic space was entirely exposed.  Standard lymph node dissection was performed in the obturator fossa.  Clips and cautery were used for a small venous channels and lymphatics.  There was very little lymph node tissue, but small packets were sent.  There were no palpable nodes.  The obturator nerves were identified bilaterally and preserved.  The endopelvic fascia was opened bilaterally.  We were able to identify the puboprostatic ligaments on both sides which were taken down sharply.  A large right-angle clamp was then placed between the dorsal vein complex and the underlying urethra.  This was doubly ligated and suture ligated after exposing the apex of the prostate. The dorsal vein complex was then incised.  The underlying urethra was appreciated.  The urethra was identified going into the prostatic apex.  A long stump of urethra was preserved.  The anterior wall of the urethra was incised.  The underlying catheter was identified and brought out the incision through the penis.  The posterior wall of the urethra was then incised.  Some ________ was needed to be sharply dissected, and then we were able to lift the apex off the rectum without any difficulty.  That plane was easily established.  The endopelvic fascia was incised bilaterally.  No attempt at _________ the operation was performed given his high Gleason score, advanced age, and concerns of overall higher likelihood  of more advanced local disease.  The seminal vesicles were identified in the midline.  With this identification, we were able to identify the pedicles of the prostate without difficulty.  These were ligated with #1 silk suture.  After removal of the pedicles, attention was turned to the bladder neck. Utilizing a  combination of sharp and blunt dissection technique, we were able to dissect the prostate right off the bladder neck, preserving the circular bladder neck fibers.  The vas were clipped in the midline and the seminal vesicles were dissected out with clips, put at both seminal vesicle arteries in the prostate.  The specimen was removed intact.  The bladder neck mucosa was then everted with some 4-0 Vicryl suture.  The bladder itself did not need to be closed.  A good urethral stump was identified with the urethral sound.  We placed five 2-0 Vicryl sutures in the urethral stump and then placed in the corresponding positions of the bladder neck.  The anastomosis was then done over a 22-French catheter and the sutures were tied.  Irrigation revealed no obvious leakage.  The wounds were all copiously irrigated and the wound was infiltrated with some Marcaine.  A Jackson-Pratt drain was placed in the retropubic space overlying the anastomosis.  The fascia was closed with a running PDS suture and the skin was closed with clips.  The procedure was very well tolerated.  Estimated blood loss was 700 cc and no obvious complications occurred. DD:  01/23/00 TD:  01/24/00 Job: [id number]

## 2010-09-02 NOTE — Op Note (Signed)
[place]. [facility]  Patient:    Derrick [name], Derrick [name] Visit Number: [id number] MRN: [id number]          Service Type: SUR Location: 2300 [id number] Attending Physician:  Melvenia Needles Proc. Date: 04/20/00 Admit Date:  04/20/2000   CC:         Titus Dubin. Alwyn Ren, M.D. [facility]             Rollene Rotunda, M.D. [facility]                           Operative Report  SURGEON:  Denman George, M.D.  ASSISTANT:  Di Kindle. Edilia Bo, M.D. and Loura Pardon, P.A.  ANESTHETIC:  General endotracheal.  ANESTHESIOLOGIST:  Judie Petit, M.D.  PREOPERATIVE DIAGNOSES: 1. A 6 cm infrarenal abdominal aortic aneurysm. 2. Bilateral common iliac artery aneurysms.  POSTOPERATIVE DIAGNOSES: 1. A 6 cm infrarenal abdominal aortic aneurysm. 2. Bilateral common iliac artery aneurysms.  PROCEDURE:  Repair of infrarenal abdominal aortic aneurysm and bilateral common iliac aneurysms with 16 x 8 bifurcated Hemashield Dacron graft.  CLINICAL NOTE:  This is a 77 year old male, who is status post prostate resection for cancer.  During work-up, he was found to have a large abdominal aortic aneurysm.  He was referred for further evaluation of this.  Work-up including arteriography was carried out.  The patient was not felt to be a good candidate for stent grafting.  Therefore, open repair was recommended and the patient consented for surgery.  Risks and benefits of the operative procedure explained in detail, including the potential risks of MI, CVA, renal failure, limb loss, infection, bleeding, and death.  OPERATIVE PROCEDURE:  Patient brought to the operating room in stable condition and placed in a supine position.  A Swan-Ganz catheter and arterial line inserted preoperatively.  General endotracheal anesthesia induced in the supine position.  Foley catheter inserted.  The abdomen and both legs were prepped and draped in a sterile fashion in a supine position.  A midline skin  incision made from xiphoid to pubis. Incision extended deeply through the subcutaneous tissue.  Dissection carried down with electrocautery.  The fascia incised.  There were extensive adhesions in the abdomen from previous laparotomies. Lysis of adhesions was carried out to adequately expose the retroperitoneum. Limited laparotomy evaluation carried out.  No large bowel masses were evident.  Several adhesions of small bowel were noted.  No distention of the small bowel was evidence.  The liver appeared normal.  No masses palpable. The bile ducts, pancreas and gallbladder were not evaluated due to extensive adhesions.  Small bowel retracted to the right, transverse colon brought superiorly.  Retroperitoneum incised over the body of a large infrarenal abdominal aortic aneurysm.  Dissection carried proximally up to the neck of the aneurysm.  The inferior mesenteric vein was mobilized and retracted superiorly.  Left renal vein mobilized and retracted.  The neck of the aneurysm cleared.  The origin of the main right renal artery was identified.  There was an adequate neck for clamp placement in the infrarenal position.  Distal dissection then carried down to the aortic bifurcation.  The common iliac arteries were freed bilaterally.  The right common iliac artery followed down to expose the right internal and external iliac arteries, which were encircled with vessel loops. The right common iliac artery was aneurysmal but tapered at the level of the iliac bifurcation.  The right ureter reflected inferiorly and preserved.  The  reflection of the peritoneum was then incised along the sigmoid colon. The sigmoid colon and mesentery mobilized medially.  The left common iliac bifurcation exposed.  The left internal iliac artery encircled with an umbilical tape and ligated at its origin.  The left external iliac artery mobilized and encircled with a vessel loop.  The ureter was  reflected medially.  The patient was administered 25 g of mannitol intravenously, 5000 units of heparin intravenously, followed by 2000 units of heparin intravenously.  The infrarenal aorta controlled with an aortic Debakey clamp.  The right internal and external iliac arteries controlled with coarctation clamps.  The left external iliac artery also controlled with a coarctation clamp.  Aneurysm sac was then opened with electrocautery.  A large amount of laminated thrombus removed.  Back bleeding from the inferior mesenteric artery was controlled with figure-of-eight 2-0 silk suture.  Backbleeding from lumbar arteries was also controlled with figure-of-eight 2-0 silk suture.  The aneurysm sac was opened up to the neck, where the aorta was divided.  The body of the 16 mm graft was then anastomosed end-to-end to the neck of the aneurysm with running 3-0 Prolene suture.  At completion of the proximal anastomosis, the aorta was flushed and each limb controlled with a Fogarty clamp.  The right limb of the graft was brought down to the right common iliac bifurcation.  The right common iliac artery divided transversely at its bifurcation.  The right limb of the graft anastomosed end-to-end to the right common iliac bifurcation with running 4-0 Prolene suture.  At completion of this anastomosis, adequate flushing carried out.  Clamps were removed.  The right leg reperfused without difficulty.  The left limb of the graft was tunneled behind the mesocolon and left ureter.  The left external iliac artery was divided transversely.  The left limb of the graft anastomosed end-to-end to the left external iliac artery with running 6-0 Prolene suture.  Clamps then removed.  Excellent flow present.  Adequate hemostasis obtained.  Sponge and instrument counts were correct.  The patient was administered 50 mg of protamine intravenously.  The aneurysm sac was then closed over the graft with running 2-0  Vicryl suture.  The retroperitoneum reapproximated over the graft with a running 3-0 Vicryl  suture.  The abdomen was examined to ensure there were no retained instruments or sponges.  Sponge and instrument counts were correct.  The midline fascia closed with running #1 PDS suture.  The skin closed with staples.  Sterile dressings applied.  The patient tolerated the procedure well.  Hemodynamically stable with good urine output and stable heart rhythm.  Transferred directly to the surgical intensive care unit. Attending Physician:  Melvenia Needles DD:  04/20/00 TD:  04/20/00 Job: [id number]

## 2010-09-02 NOTE — Cardiovascular Report (Signed)
[name]. [facility]  Patient:    Derrick [name], Derrick [name]                     MRN: [id number] Proc. Date: 03/15/00 Adm. Date:  [id number] Disc. Date: [id number] Attending:  Melvenia Needles CC:         Titus Dubin. Alwyn Ren, M.D. [facility]  Barron Alvine, M.D.  Peripheral Catheterization Lab   Cardiac Catheterization  PREOPERATIVE DIAGNOSIS:  Abdominal aortic aneurysm.  POSTOPERATIVE DIAGNOSIS:  Abdominal aortic aneurysm.  OPERATION:  Abdominal aortogram with bilateral pelvic arteriogram.  SURGEON:  Denman George, M.D.  CONTRAST:  120 ml Visipaque.  ACCESS:  Right common femoral artery, 5-French sheath.  COMPLICATIONS:  None known.  CLINICAL NOTE:  This is a 77 year old male who is status post prostatectomy for prostate cancer.  He is known to have a 6 cm abdominal aortic aneurysm.   He has undergone CT scan verifying this and is brought to the catheterization lab at this time for diagnostic arteriography.  DESCRIPTION OF PROCEDURE:  The patient was brought to the [facility] Peripheral Vascular Catheterization Lab in stable condition.  Informed consent obtained.  Placed in supine position.  Both groins prepped and draped in a sterile fashion.  Skin and subcutaneous tissue in the right groin instilled with 1% Xylocaine. The patient was administered 2 mg of Versed, 2 mg of Nubain intravenously.  A needle was easily introduced into the right common femoral artery.  A 0.035 J wire passed through the needle into the mid abdominal aorta.  The needle removed, a 5-French sheath advanced over the guidewire into the right common femoral artery.  A graduated pigtail catheter then advanced over the guidewire into the suprarenal aorta.  Standard AP mid abdominal aortogram obtained.  This revealed single bilateral renal arteries which were widely patent.  There was approximately a 1 cm infrarenal aortic neck beyond which was identified an abdominal aortic aneurysm.   The common iliac arteries bilaterally were noted also to be aneurysmal.  A lateral aortogram was then obtained.  This verified a large infrarenal abdominal aortic aneurysm.  Brisk flow was noted in the superior mesenteric artery without evidence of stenosis.  The pigtail catheter was then brought down into the aneurysm sac and standard AP pelvic arteriograms obtained.  This revealed the common iliac arteries bilaterally to be dilated consistent with aneurysmal change.  The aneurysmal change in the common iliac arteries extended down to the origin of the internal and external iliac arteries bilaterally.  Oblique pelvic arteriograms were then obtained in the right anterior oblique and left anterior oblique projections.  These projections also verified extension of the common iliac aneurysm disease down to the origin of the internal and external iliac arteries bilaterally.  This completed the arteriogram procedure.  The patient tolerated this well without complication.  The guidewire was reinserted and the pigtail catheter removed.  The patient was transferred to the holding area in stable condition for removal of right common femoral sheath.  No apparent complication.  FINAL IMPRESSION: 1. Large infrarenal abdominal aortic aneurysm measuring 6 cm by CT scan. 2. Bilateral common iliac artery aneurysm disease.  DISPOSITION:  These results will be reviewed with the patient and family and recommendations made.  It appears that he will not be a candidate for aortic stent grafting and will require open operative repair. DD:  04/04/00 TD:  04/05/00 Job: [id number]

## 2010-09-02 NOTE — Op Note (Signed)
NAMEWADELL, CRADDOCK NO.:  [id number]   MEDICAL RECORD NO.:  [id number]          PATIENT TYPE:  INP   LOCATION:  2871                         FACILITY:  [facility]   PHYSICIAN:  Sharlet Salina T. Hoxworth, M.D.DATE OF BIRTH:  11-Dec-1928   DATE OF PROCEDURE:  01/20/2004  DATE OF DISCHARGE:                                 OPERATIVE REPORT   PREOPERATIVE DIAGNOSES:  1.  Status post Hartmann colectomy with end colostomy.  2.  Ventral incisional hernia.   SURGICAL PROCEDURES:  1.  Takedown of Hartmann colostomy.  2.  Repair of ventral incisional hernia with AlloDerm.   SURGEON:  Lorne Skeens. Hoxworth, M.D.   ASSISTANT:  Thornton Park. Daphine Deutscher, M.D.   ANESTHESIA:  General.   BRIEF HISTORY:  Mr. Sudbury is a 77 year old male approximately one year  following Hartmann colectomy and colostomy for perforated diverticulitis  with abscess.  He has had multiple other abdominal procedures including  ventral hernia repair.  He has done well postoperatively although has  developed a ventral incisional hernia in the upper portion of his midline  incision.  He is now brought to the operating room for takedown of his  Gertie Gowda colostomy and repair of his ventral incisional hernia.  Nature of  procedure, indications, risks of bleeding, infection, anastomotic leak, and  recurrent hernia were discussed and understood.   DESCRIPTION OF OPERATION:  The patient was brought to the operating room and  placed in supine position on the operating table and general endotracheal  anesthesia was induced.  A Foley catheter was placed.  PS were placed.  He  received broad-spectrum preoperative antibiotics.  A mechanical antibiotic  bowel prep had been performed at home.  Initially, I sutured the colostomy  closed with a pursestring suture of 2-0 silk.  The abdomen was widely  sterilely prepped and draped with Ioban drape.  The patient's previous  midline long incision was used excising the dermal scar and  dissection was  carried down to the midline fascia which was incised with cautery.  There  was a several cm hernia sac above the umbilicus that was entered.  There  were some filmy adhesions of omentum and bowel to the anterior abdominal  wall that were carefully taken down.  Extensive adhesiolysis was then  performed.  Adhesions were fairly extensive but filmy and easy to deal with.  All the small bowel was dissected free and packed into the upper abdomen.  The rectosigmoid pouch was identified and was mobilized.  It appeared  healthy.  The very end of the pouch was cleared of pericolic fat and  mesentery and, then, the staple line excised down below in normal healthy-  appearing bowel in preparation for anastomosis.  Following this, the left  colon was identified and completely freed up the colostomy site at which  point the colon was divided with a GIA stapler.  Left colon was further  mobilized dividing peritoneum along the line of Toldt and the splenic  flexure partially mobilized to allow the left colon to be brought down  easily without tension.  This end of bowel was  then resected back several  centimeters to healthy-appearing bowel.  Following this, an end-to-end  anastomosis was created between the left colon and rectosigmoid, single  layer with inverting 2-0 silk sutures.  This was done under no tension with  good blood supply, and anastomosis appeared nicely patent.  Following this,  the abdomen was thoroughly irrigated with saline and hemostasis assured.  The viscera were returned to the anatomic position.  Attention was then  turned to the fascial closure in incisional hernia repair.  Skin and  subcutaneous flaps were raised back several cm in all directions and in the  upper abdomen in an area of the incisional hernia.  The lower fascia was  intact and normal, was mobilized just minimally for closure.  The fascia was  then closed with running #1 PDS begun at either end and  incision tied  centrally with a couple of reinforcing simple 0 PDS sutures.  A piece of  AlloDerm biomaterial had been hydrated.  A 12 x 6 cm piece was used as an  onlay over the upper portion of the incision covering several centimeters in  all directions in the area of the previous incisional hernia.  This was then  packed down to the fascia on all edges with interrupted 0 Novafil with nice  broad coverage.  A closed suction drain was left in the subcutaneous space,  the wound was irrigated, and the skin closed with staples.  This wound was  then isolated as the colostomy site was elliptically excised transversely  and the stoma removed down to the peritoneal cavity.  There was a very small  parastomal hernia, and this hernia sac was incised and the fascial edges  defined and mobilized slightly.  The fascia was closed with interrupted  figure-of-eight sutures of #1 Novafil.  The subcutaneous tissue was  irrigated and skin closed with staples.  Sponge, instrument, and needle  counts were correct.  Gauze dressings were applied and the patient taken to  the recovery room in good condition.       BTH/MEDQ  D:  01/20/2004  T:  01/20/2004  Job:  [id number]

## 2010-09-02 NOTE — Discharge Summary (Signed)
NAMERAYNALD, ROUILLARD NO.:  [id number]   MEDICAL RECORD NO.:  [id number]          PATIENT TYPE:  INP   LOCATION:  5730                         FACILITY:  [facility]   PHYSICIAN:  Sharlet Salina T. Hoxworth, M.D.DATE OF BIRTH:  1928/07/20   DATE OF ADMISSION:  01/20/2004  DATE OF DISCHARGE:  01/27/2004                                 DISCHARGE SUMMARY   DISCHARGE DIAGNOSES:  1.  Status post Hartmann colectomy and colostomy for perforated      diverticulitis.  2.  Incisional hernia.   PROCEDURE:  Takedown of Hartmann colostomy and repair of incisional hernia  January 20, 2004.   HISTORY OF PRESENT ILLNESS:  Derrick [name] is a 77 year old male approximately  one year following Hartmann colectomy and colostomy for perforated  diverticulitis.  He has done well although has developed an incisional  hernia.  The patient is now admitted electively for takedown of his Hartmann  colostomy and repair of his incisional hernia.   PAST MEDICAL HISTORY:  Generally unremarkable for significant medical  problems.  He does have a history of mild GERD and sinus congestion. The  surgery is significant for prostate cancer in 2001 and also cholecystectomy,  abdominal aortic aneurysm repair 2002 and incisional hernia repair in 2004.   MEDICATIONS:  Decongestant and Prilosec p.r.n.  No known drug allergies.   SOCIAL HISTORY:  Married.  Previous long cigarette history.   PHYSICAL EXAMINATION:  GENERAL:  Thin white male in no distress.  VITAL SIGNS:  Normal.   Pertinent exam was limited to the abdomen which revealed a healthy colostomy  in the left mid abdomen and moderate sized midline incisional hernia.   HOSPITAL COURSE:  The patient was admitted on the morning of his procedure,  underwent takedown of his colostomy with anastomosis and repair of his  incisional hernia using AlloDerm.  His postoperative course was very smooth.  He had no complications.  He was started on a clear liquid on the  second  postoperative day. He was able to be gradually advanced to a regular diet  with a moderate expected ileus that resolved and has begun having bowel  movements prior ot discharge.  At the time of discharge, he is up and about.  The wound is healing primarily.  Colostomy is functioning.  Tolerating  regular diet.  No evidence of infection.   DISCHARGE MEDICATIONS:  Same as admission plus Tylox for pain.  Followup is  to be in my office in one week.      BTH/MEDQ  D:  03/01/2004  T:  03/02/2004  Job:  [id number]

## 2010-09-02 NOTE — Discharge Summary (Signed)
[place]  Patient:    Derrick [name], Derrick [name]                     MRN: [id number] Adm. Date:  [id number] Disc. Date: [id number] Attending:  Melvenia Needles Dictator:   Areta Haber, P.A.C. CC:         Denman George, M.D.  Titus Dubin. Alwyn Ren, M.D. [facility]  Barron Alvine, M.D.  Rollene Rotunda, M.D. [facility]   Discharge Summary  HISTORY OF PRESENT ILLNESS:  This is a 77 year old male referred by Dr. Alwyn Ren to Dr. Madilyn Fireman for evaluation of abdominal aortic aneurysm.  A recent primary care visit secondary to claudication, revealed the finding of an abdominal aortic aneurysm.  Ultrasound confirmed a 6 cm infrarenal abdominal aortic aneurysm.  There was also noted bilateral common iliac artery aneurysms. Patient was evaluated and felt to be a candidate for elective resection and grafting by Dr. Madilyn Fireman and was admitted this hospitalization for the procedure.   PAST MEDICAL HISTORY:  1. Abdominal aortic aneurysm.  2. Prostate cancer.  3. Degenerative joint disease, and osteoarthritis.  4. Peptic ulcer disease.  5. History of tobacco abuse.  6. History of syncopal episode in 1989 without recurrence.  7. Chronic pulmonary obstructive disease.  8. Thrombophlebitis 1962.  9. Claudication. 10. Incontinence following prostate surgery.  PAST SURGICAL HISTORY: Exploratory laparotomies secondary to adhesions.  A "ulcer surgery" in 1956, history of prostate surgery in October 2001.  MEDICATIONS ON ADMISSION:  None.  ALLERGIES:  NONE.  FAMILY HISTORY, SOCIAL HISTORY, REVIEW OF SYMPTOMS, AND PHYSICAL EXAMINATION: Please see the history and physical done on admission.  HOSPITAL COURSE: Patient was admitted electively April 20, 2000 and underwent the following procedure.  Aorta by iliac bypass with placement of 14x 7 background Hemashield graft.  Patient tolerated the procedure well and was taken to the surgical intensive care unit in stable  condition.  POSTOPERATIVE HOSPITAL COURSE:  Postoperative course has been entirely unremarkable.  He remained hemodynamically stable.  He tolerated rapid and routine advancement in activities and diet following postoperative ileus.  His incisions were healing well.  He remained afebrile.  Laboratory vales remained stable.  Hemoglobin and hematocrit dated April 24, 2000 was 10 and 31 respectively.  Oxygen was weaned, and he maintained good saturations on room air.  He had a postoperative bowel movement.  He was overall felt to be quite stable for discharged on April 28, 2000.  Condition on discharge was stable and improved.  FINAL DIAGNOSIS:  Abdominal aortic aneurysm, infrarenal with iliac artery involvement.  OTHER DIAGNOSES:  1. Prostate cancer.  2. Osteoarthritis.  3. Degenerative joint disease.  4. Peptic ulcer disease.  5. Tobacco abuse.  6. History of a syncopal episode in 1989 without recurrence.  7. History of chronic obstructive pulmonary disease.  8. History of thrombophlebitis in 1962.  9. History of claudication. 10. History of incontinence.  INSTRUCTIONS:  The patient received written instructions regarding medications, activity, diet, wound care, and follow up.  Follow up included stable removal at the [facility] office.  Additional, appointment would be arranged for the patient to see Dr. Madilyn Fireman post discharge in two weeks.  MEDICATIONS ON DISCHARGE:  Tylox one to two q. 4-6h. p.r.n. DD:  05/23/00 TD:  05/24/00 Job: [id number]

## 2010-09-02 NOTE — Discharge Summary (Signed)
[facility]  Patient:    Derrick [name], Derrick [name]                     MRN: [id number] Adm. Date:  [id number] Disc. Date: [id number] Attending:  Thermon Leyland CC:         Jolly Mango, M.D.  Di Kindle. Edilia Bo, M.D.   Discharge Summary  DISCHARGE DIAGNOSES: 1. Malignant neoplasm of the prostate pathologic stage PT2B. 2. Chronic obstructive pulmonary disease. 3. Abdominal aortic aneurysm.  PROCEDURE:  Pelvic lymph node dissection and radical retropubic prostatectomy on January 23, 2000.  HOSPITAL COURSE:  Derrick [name] is a 77 year old male. He had been diagnosed with clinical stage T1C adenocarcinoma of the prostate. The concerning factors included a relatively high Gleason ______ type of 4+4=8. His PSA was only mildly elevated in the 6-7 range but he did also have a low serum testosterone level. There was no evidence of metastatic disease on CT scan or bone scan. Incidentally, the patient was noted to have an abdominal aneurysm which is being further evaluated by Dr. Edilia Bo. The patient underwent extensive counseling and elected to have radical retropubic prostatectomy as his treatment option. He underwent uneventful surgery on January 23, 2000. Estimated blood loss was 700 cc. He had no significant problems intraoperatively. The patients postoperative hemoglobin was 11.2. The rest of his labs were relatively unremarkable. His postoperative course was uneventful. He continued to have good urinary outflow with minimal JP drainage. He was discharged home on postoperative day 3. At that time he was tolerating a general diet well. He was afebrile. His wound was healing nicely and he had no other complications or other abnormalities. Final pathology revealed a Gleasons 3+5=8 adenocarcinoma of the prostate. There was bilateral involvement but no evidence of disease outside the gland.  DISPOSITION:  The patient was discharged to home. He was discharged with  an indwelling Foley catheter to a leg bag. He was given a prescription for Tylox. He was given routine post radical prostatectomy instructions and will follow-up in our office in approximately 5-7 days. DD:  02/08/00 TD:  02/08/00 Job: [id number]

## 2010-09-02 NOTE — Discharge Summary (Signed)
NAME:  Derrick [name], DOLINGER NO.:  [id number]   MEDICAL RECORD NO.:  [id number]                   PATIENT TYPE:  INP   LOCATION:  5705                                 FACILITY:  [facility]   PHYSICIAN:  Sharlet Salina T. Hoxworth, M.D.          DATE OF BIRTH:  08/21/1928   DATE OF ADMISSION:  05/05/2003  DATE OF DISCHARGE:  05/19/2003                                 DISCHARGE SUMMARY   DISCHARGE DIAGNOSES:  1. Perforated sigmoid diverticulitis with abscess.  2. Cholelithiasis.  3. Chronic obstructive pulmonary disease.   PROCEDURE:  Laparotomy with sigmoid colectomy, end colostomy Derrick [name]  procedure), and cholecystectomy on May 06, 2003.   HISTORY OF PRESENT ILLNESS:  The patient is a 77 year old white male with a  history of multiple previous abdominal operations.  Over a several-month  period he has been having intermittent low abdominal and right mid abdominal  pain.  He has been followed by Dr. Corinda [name] and Dr. Daphine [name].  Recent  ultrasound showed gallstones.  CT scan was done last week for workup of his  pain showing diverticulosis and gallstones.  After his CT last week, he  developed diarrhea and took Imodium.  Over the past three days, he has not  had a bowel movement and developed increasing lower abdominal and right mid  abdominal pain.  He vomited once three days ago and has remained nauseated  without further vomiting.  His last bowel movement was yesterday morning.  He also noted progressive abdominal distention.  He presented to the  emergency room today.   PAST MEDICAL HISTORY:  1. Partial gastrectomy for peptic ulcer disease in 1956.  2. Prostatectomy for cancer in 2001 by Derrick [name], M.D.  3. Abdominal aortic aneurysm repair in 2002 by Derrick [name], M.D.  4. Laparotomy for small bowel obstruction in 1990 by Dr. Daphine [name].  5. Ventral hernia repair with mesh in 2004 by Dr. Daphine [name].   Medically, he is followed for COPD, peripheral vascular  disease, and history  of thrombophlebitis in 1962.   MEDICATIONS:  Only Prevacid with his recent illness.   ALLERGIES:  No known drug allergies.   SOCIAL HISTORY:   FAMILY HISTORY:   REVIEW OF SYSTEMS:  See admission H&P.   PHYSICAL EXAMINATION:  VITAL SIGNS:  Temperature 97.4, vital signs within  normal limits.  GENERAL:  This is an elderly white male in no acute distress.  ABDOMEN:  Well-healed incisions.  There was mild diffuse lower abdominal  tenderness, moderate right mid abdominal tenderness with slight guarding.   LABORATORY DATA:  White count 15.8 thousand, hemoglobin 16.5, BUN and  creatinine elevated at 26 and 2.1, respectively.  Flat and upright of the  abdomen showed distended loops of small bowel with air-fluid levels  consistent with small bowel obstruction.   HOSPITAL COURSE:  The patient was admitted with impression of small bowel  obstruction of uncertain etiology and chronic and worsening abdominal pain.  He was treated with IV hydration, pain medication, and NG tube was placed.  On the first postoperative day, white count was decreased to 4.7.  Urine  output was good.  Creatinine was decreased to 1.6.  Flat and upright  abdominal x-ray showed persistent evidence of small bowel obstruction.  Laparoscopy was recommended and accepted.  He was taken to the operating  room on January 19.  At the time of laparotomy, he was found to have a good-  sized right-sided abdominal abscess secondary to perforated sigmoid colon  diverticulitis.  He underwent drainage of his abscess sigmoid colectomy and  cholecystectomy with lysis of inflammatory adhesions which had been causing  his bowel obstruction.  He was treated with broad-spectrum antibiotics  perioperatively and postoperatively.  PICC line was started and he was begun  on TNA for nutritional support.  On the second postoperative day, he was up  in a chair fairly comfortable.  Urine output was good.  White count was  7.3  thousand.  He was transferred to the floor.  Activity was gradually  increased.  On January 23, he had some nausea and vomiting after his NG tube  was removed.  Also some dyspnea.  Chest CT was negative for PE.  His  shortness of breath had resolved.  By the fifth postoperative day his  colostomy began functioning and he was feeling much better.  He was begun on  a clear liquid diet on January 25.  He was able to tolerate this well and  was gradually advanced to a regular diet.  TNA was discontinued.  He did  have one further episode of bilious emesis on January 27 and white count was  increased to 15,000.  Abdominal x-ray showed some mild ileus.  This quickly  resolved and on January 28, he again felt well.  He was able to tolerate a  diet.  Colostomy continued to function.  Wound healed primarily.  He was  instructed in colostomy care prior to discharge.  Home health was arranged  for instruction.  He was felt ready for discharge on May 17, 2003.  Abdominal was soft and nontender, wound well-healed.  Tolerating regular  diet.  Colostomy functioning well.  He was discharged home at this time.  IV  antibiotics were discontinued at the time of discharge.   FOLLOW UP:  In my office in one week.                                                Lorne Skeens. Hoxworth, M.D.    Tory Emerald  D:  06/24/2003  T:  06/25/2003  Job:  [id number]   cc:   Derrick [name], M.D. [facility]

## 2010-09-02 NOTE — Op Note (Signed)
NAME:  Derrick [name], Derrick [name]:  [id number]   MEDICAL RECORD NO.:  [id number]                   PATIENT TYPE:  INP   LOCATION:  3308                                 FACILITY:  [facility]   PHYSICIAN:  Sharlet Salina T. Hoxworth, M.D.          DATE OF BIRTH:  1928-11-24   DATE OF PROCEDURE:  05/06/2003  DATE OF DISCHARGE:                                 OPERATIVE REPORT   PREOPERATIVE DIAGNOSES:  1. Small bowel obstruction.  2. Cholelithiasis.   POSTOPERATIVE DIAGNOSES:  1. Small bowel obstruction secondary to #2.  2. Perforated sigmoid colon/diverticulitis with abscess.  3. Cholelithiasis.   OPERATION PERFORMED:  1. Laparotomy with lysis of adhesions for small bowel obstruction.  2. Drainage of pelvic abscess.  3. Sigmoid colectomy with end colostomy and Hartmann pouch and     cholecystectomy.   SURGEON:  Lorne Skeens. Hoxworth, M.D.   ASSISTANTAngelia Mould. Derrell Lolling, M.D.   ANESTHESIA:  General.   INDICATIONS FOR PROCEDURE:  Mr. Sabo is a 77 year old white male with a  history of multiple abdominal procedures including abdominal aortic aneurysm  repair, lysis of adhesions, repair of ventral hernia with mesh and  reportedly a partial gastrectomy in the 1950s.  He presents now with several  weeks of gradually worsening lower abdominal pain.  CT was done last week as  an outpatient indicating diverticulosis and cholelithiasis.  He developed  diarrhea four to five days ago and then took Imodium which stopped the  diarrhea but he developed worsening lower abdominal pain, nausea, vomiting  and generalized abdominal cramping and presented to the emergency room  yesterday.  Plain x-rays have shown evidence of high grade small bowel  obstruction which have not improved on today's follow-up exam.  He remains  moderately tender.  Laparotomy for bowel obstruction has been recommended  and accepted.  The nature of the procedure, its indications and risks of  bleeding,  infection, intestinal injury were discussed and understood.  The  patient also has known gallstones and with his abdominal pain, we plan  cholecystectomy at the same time.   DESCRIPTION OF PROCEDURE:  The patient was brought to the operating room and  placed in supine position on the operating table and general endotracheal  anesthesia was induced.  A Foley catheter was placed.  He was given broad  spectrum antibiotics preoperatively.  The abdomen was widely sterilely  prepped and draped.  Previous midline incision was opened along its  midportion and dissection carried down to the midline fascia.  Below the  umbilicus, the peritoneal cavity was entered.  There were extensive  adhesions of omentum and small bowel to the anterior abdominal wall that  were tediously lysed.  The incision was extended upward above the umbilicus  where a previously placed piece of mesh about 6 cm in diameter was  encountered in the preperitoneal space.  There was a lot of reaction around  this and this  was dissected away from adhesions to the fascia.  The small  bowel was completely removed.  There were further adhesions up in the  superior abdomen with dilated loops of small bowel and the incision was  extended eventually from close to xiphoid to pubis the same length of his  previous long midline incision.  The preperitoneal cavity was entered more  laterally on either side of the lower abdomen and multiple dilated loops of  small bowel.  Dissection was carried down into the pelvis and at this point  a large pelvic abscess cavity with frank pus was entered in the midline and  left lower quadrant a large amount of purulent material drained.  This was  cultured.  The source of this was initially unclear due to multiple  adhesions of small bowel into the pelvis.  Further complete adhesiolysis was  performed to the anterior abdominal wall until it was completely freed and  then multiple interloop small bowel  adhesions were completely lysed with  multiple dilated loops of small bowel proximally.  Small bowel was thin and  somewhat friable and in two areas where adhesions had tethered it, with  really fairly gentle traction on this bowel, enterotomies were created.  Both these areas were debrided and closed in two layers with running 3-0  chromic and seromuscular 3-0 silk sutures.  Through the enterotomy, the  small bowel contents were decompressed where it had been markedly extended.  Eventually, all the small bowel adhesions were completely lysed from the  ligament of Treitz to the ileocecal valve.  The small bowel anatomy was  normal here, noted due his history of previous gastrectomy in the 1950s.  The stomach appeared fairly normal sized.  There were adhesions around the  distal stomach.  I wonder if he might have actually had a pyloroplasty based  on the relatively normal anatomy.  At this point after all small bowel  adhesions were lysed which took approximately two hours, the sigmoid colon  was exposed and there was seen to be an area of marked acute sigmoid  diverticulitis with perforation resulting in the abscess and adhesions of  small bowel down to this area in the pelvis resulting in small bowel  obstruction.  The small bowel itself was intrinsically normal.  At this  point we proceeded with sigmoid colectomy.  The sigmoid colon and distal  left colon were mobilized dividing the peritoneum on the lateral line of  Toldt mobilizing the distal left colon and sigmoid colon up into the  abdomen.  We stayed anterior to where the ureter may be located.  The colon  was normal relatively close proximally and distally to the area of acute  inflammation and perforation.  Points of proximal and distal resection were  chosen and cleaned of pericolic fat and mesentery and divided with the GIA  stapler.  This left a fairly good sized distal Hartmann pouch.  The mesentery affected segment was  divided between clamps, tied with 2-0 silk  ties and the specimen removed.  The pelvis was thoroughly irrigated with  saline.  Again as planned, we then proceeded with cholecystectomy.  The  gallbladder was exposed and the fundus retracted laterally.  Some omental  adhesions were taken down off the infundibulum which was retracted  inferolaterally and the peritoneum of Calot's triangle was incised.  Using  blunt dissection, the cystic artery and cystic duct were clearly defined and  dissected 360 degrees and when the anatomy was clear, the cystic  duct and  cystic artery were divided between two proximal and distal clips and the  gallbladder was dissected free from its bed using cautery to remove it  intact.  Hemostasis assured in the gallbladder bed.  A left colostomy was  then created.  The skin button was excised and the cruciate fascial incision  made and peritoneum entered and the opening dilated two fingers.  The distal  left colon was brought out easily as an end colostomy with plenty of length.  It was tacked to the peritoneum with several interrupted silk sutures.  Following this, the abdomen was copiously irrigated with several liters of  warm saline and gloves changed.  The small bowel was again run from the  ligament of Treitz to the ileocecal valve and was completely free.  The  viscera were returned to the anatomic position.  The midline fascia was  closed with running #1 PDS beginning at either end of the incision and tied  centrally.  The subcutaneous tissue was irrigated and the skin closed with  staples.  The midline wound was isolated and the colostomy was then matured,  excising the staple line and suturing the colon to skin with full thickness  3-0 Vicryl sutures.  Sponge and instrument counts were correct.  Colostomy  bag and dry sterile dressing were applied.  The patient was then taken to  recovery in satisfactory condition.                                                Lorne Skeens. Hoxworth, M.D.    Tory Emerald  D:  05/06/2003  T:  05/07/2003  Job:  [id number]

## 2010-09-02 NOTE — Op Note (Signed)
NAME:  BORIS, ENGELMANN NO.:  [id number]   MEDICAL RECORD NO.:  [id number]                   PATIENT TYPE:  AMB   LOCATION:  DAY                                  FACILITY:  [facility]   PHYSICIAN:  Thornton Park. Daphine Deutscher, M.D.             DATE OF BIRTH:  October 15, 1928   DATE OF PROCEDURE:  04/18/2002  DATE OF DISCHARGE:                                 OPERATIVE REPORT   PREOPERATIVE DIAGNOSIS:  Ventral hernia.   POSTOPERATIVE DIAGNOSIS:  Ventral incisional hernia, status post repair with  Bard Ventralex 16 cm mesh.   SURGEON:  Thornton Park. Daphine Deutscher, M.D.   ANESTHESIA:  General endotracheal.   DESCRIPTION OF PROCEDURE:  Mr. Hoge is a 77 year old gentleman, who has  had a bulging mass above his umbilicus.  It has occurred after an aortic  aneurysmectomy a few years ago.  He was seen in the holding area, and the  area that had been previously noted was marked.  He was taken to room 1 and  given general anesthesia.  Preoperatively, he did receive 1 g of Ancef.  After anesthesia was administered, the abdomen was prepped with Betadine and  draped sterilely.  I excised his old scar and went down and dissected free a  hernia sac.  I went around the edges of the fascia and then freed it up  anteriorly and as well as posteriorly.  Posteriorly I went back in about 1-  1.5 inches all the way around the perimeter.  This created a pretty good  sized defect, but I did not enter into the free abdominal cavity except on  the right side where to the patient's previous paramedian incision, there  were some of adhesions to the bowel stuck directly to the anterior abdominal  wall.  I carefully took those down and kept those well-away from this  repair.  I got circular piece of mesh that was approximately 3.5 inches in  diameter and was able to just insert this into the opening.  It deployed  with the rim with holding it in place and then I with eight sutures of 0  Prolene, sutured  the fascial ring to the Marlex mesh that was bonded to the  Gore-Tex mesh, creating this composite mesh graft.  This anchored it all the  way around the perimeter with equal forces and good seating of the mesh.  The area was then irrigated and was closed with 4-0 Vicryl subcutaneously to  close this over the mesh, and subsequently his skin was closed with Vicryl  subcutaneously and then with staples.  The patient seemed to tolerate the  procedure  well.  An abdominal binder was applied, and he was taken to the recovery  room after awakening in satisfactory condition.  He will be kept for  overnight observation.   FINAL DIAGNOSIS:  Status post ventral hernia repair with composite mesh.  Thornton Park Daphine Deutscher, M.D.    MBM/MEDQ  D:  04/18/2002  T:  04/18/2002  Job:  [id number]   cc:   Titus Dubin. Alwyn Ren, M.D. [facility]   Valetta Fuller, M.D.  [address]  [place]  [place] [address]  Fax: [phone]

## 2010-09-02 NOTE — H&P (Signed)
[facility]  Patient:    Derrick [name], Derrick [name]                     MRN: [id number] Adm. Date:  [id number] Attending:  Thermon Leyland CC:         Titus Dubin. Alwyn Ren, M.D. [facility] S. Edilia Bo, M.D.   History and Physical  ADMITTING DIAGNOSIS:  Stage T1C adenocarcinoma of the prostate.  HISTORY OF PRESENT ILLNESS:  Derrick [name] is a 77 year old male.  He was sent to me with a mildly elevated PSA in the 6-7 range.  He is also noted to have significant testicular atrophy, and therefore a testosterone was obtained, which was very low.  We felt for that reason that his elevated PSA was quite concerning.  In addition, he had a reduced PSA-2 reading.  Biopsies revealed a poorly-differentiated carcinoma with a Gleasons score of 4+4 equals 8.  This situation indicated a potential for a more aggressive carcinoma.  A bone scan and CT scan were, fortunately, unremarkable.  We felt that the patient had a chance at organ-confined disease, but obviously there was increased risk given his low testosterone in the face of only a mildly elevated PSA and the fact that his Gleasons score was higher.  The patient requested aggressive therapy.  As part of his imaging study, he was noted to have an incidentally-noted abdominal aneurysm.  He was evaluated by Dr. Waverly Ferrari.  We worked together on establishing a plan for Derrick [name].  We have discussed with him the options of radiation therapy versus radical retropubic prostatectomy.  He appeared to understand all the advantages and disadvantages of the various treatment forms.  He requested surgical intervention.  The plan was to proceed with the radical retropubic prostatectomy and assuming that he did well clinically and that his prognosis was reasonable, that he would then subsequently undergo aneurysm repair at a later date.  He is otherwise asymptomatic with no significant voiding symptoms.  He certainly has  no symptoms suggestive of advanced prostate carcinoma.  PAST MEDICAL HISTORY:  Relatively unremarkable.  He takes no regular medications.  He does have approximately a 50 pack-year smoking history and certainly some COPD.  ALLERGIES:  He has no drug allergies.  REVIEW OF SYSTEMS:  Negative for significant voiding symptoms, bony pain, etc.  PHYSICAL EXAMINATION:  GENERAL:  He was a well-developed, well-nourished male.  VITAL SIGNS:  His weight was approximately 176 pounds.  Blood pressure 130/96 with a pulse of 60.  He was afebrile.  NECK:  No evidence of JVD or masses.  CHEST:  Clear to auscultation.  ABDOMEN;  Soft and nontender.  The patient did have an upper midline incision from a perforated ulcer in the 1950s.  GENITOURINARY:  External genitalia revealed an uncircumcised penis without lesions.  Testicles were atrophic.  Prostate was 1+ without worrisome nodules or induration.  EXTREMITIES:  Without edema.  LABORATORY DATA:  Hemoglobin was normal at 15.4.  BMET was unremarkable. Preoperative imaging was unremarkable.  ASSESSMENT:  Clinical stage T1C adenocarcinoma of the prostate.  The patient was to undergo a radical retropubic prostatectomy with pelvic lymph node dissection today unless significant local problems are noted at the time of surgery.  He will then be admitted for routine postoperative care. DD:  01/23/00 TD:  01/24/00 Job: [id number]

## 2011-01-11 LAB — CBC
Hemoglobin: 15.2
MCHC: 34.5
Platelets: 107 — ABNORMAL LOW
RDW: 12.7

## 2011-01-11 LAB — DIFFERENTIAL
Lymphs Abs: 1
Monocytes Relative: 9
Neutro Abs: 3.6
Neutrophils Relative %: 70

## 2011-01-11 LAB — COMPREHENSIVE METABOLIC PANEL
ALT: 16
Albumin: 3.7
BUN: 18
Calcium: 9.9
Glucose, Bld: 103 — ABNORMAL HIGH
Sodium: 145
Total Protein: 6.5

## 2011-01-11 LAB — APTT: aPTT: 30

## 2011-01-11 LAB — PROTIME-INR: INR: 1

## 2011-11-13 ENCOUNTER — Ambulatory Visit (HOSPITAL_BASED_OUTPATIENT_CLINIC_OR_DEPARTMENT_OTHER)
Admission: RE | Admit: 2011-11-13 | Discharge: 2011-11-13 | Disposition: A | Discharge status: Home / Self Care | Payer: Medicare Other | Source: Ambulatory Visit | Attending: Internal Medicine | Admitting: Internal Medicine

## 2011-11-13 ENCOUNTER — Encounter: Payer: Self-pay | Admitting: Internal Medicine

## 2011-11-13 ENCOUNTER — Ambulatory Visit (INDEPENDENT_AMBULATORY_CARE_PROVIDER_SITE_OTHER): Payer: Medicare Other | Admitting: Internal Medicine

## 2011-11-13 VITALS — BP 130/70 | HR 81 | Temp 98.2°F | Wt 167.6 lb

## 2011-11-13 DIAGNOSIS — R509 Fever, unspecified: Secondary | ICD-10-CM | POA: Insufficient documentation

## 2011-11-13 DIAGNOSIS — R05 Cough: Secondary | ICD-10-CM

## 2011-11-13 DIAGNOSIS — K5730 Diverticulosis of large intestine without perforation or abscess without bleeding: Secondary | ICD-10-CM

## 2011-11-13 DIAGNOSIS — K5790 Diverticulosis of intestine, part unspecified, without perforation or abscess without bleeding: Secondary | ICD-10-CM

## 2011-11-13 DIAGNOSIS — J069 Acute upper respiratory infection, unspecified: Secondary | ICD-10-CM

## 2011-11-13 DIAGNOSIS — R059 Cough, unspecified: Secondary | ICD-10-CM | POA: Insufficient documentation

## 2011-11-13 DIAGNOSIS — Z8546 Personal history of malignant neoplasm of prostate: Secondary | ICD-10-CM | POA: Insufficient documentation

## 2011-11-13 DIAGNOSIS — R197 Diarrhea, unspecified: Secondary | ICD-10-CM

## 2011-11-13 DIAGNOSIS — J449 Chronic obstructive pulmonary disease, unspecified: Secondary | ICD-10-CM | POA: Insufficient documentation

## 2011-11-13 DIAGNOSIS — R0602 Shortness of breath: Secondary | ICD-10-CM | POA: Insufficient documentation

## 2011-11-13 DIAGNOSIS — J4489 Other specified chronic obstructive pulmonary disease: Secondary | ICD-10-CM | POA: Insufficient documentation

## 2011-11-13 MED ORDER — DOXYCYCLINE HYCLATE 100 MG PO TABS: ORAL_TABLET | ORAL | Status: DC

## 2011-11-13 NOTE — Patient Instructions (Addendum)
Order for x-rays entered into  the computer; these will be performed at [facility]. No appointment is necessary.   Immodium AD as needed for frank diarrhea. If diarrhea persists, stool studies would be indicated to check  for various bacterial infections  and ova & parasites. Please try to go on My Chart within the next 24 hours to allow me to release the results directly to you.

## 2011-11-13 NOTE — Progress Notes (Signed)
  Subjective:    Patient ID: Derrick [name], male    DOB: 11-13-1928, 78 y.o.   MRN: [id number]  HPI 11/09/11 he developed diarrhea which was frankly watery on 3 occasions. This was associated with fever up to 101.6 and anorexia over the next 3 days. On 7/28 he again had 2-3 diarrheal bowel movements. He did not take any medication for the diarrhea. He does drink well water. He felt nauseated but has not vomited. He has had no diarrhea today  Significantly he had a partial colectomy in 2005 for ruptured diverticuli. He had reastomosis in the same year    Review of Systems He denies abdominal pain, weight loss, decreased urine output,or  lightheadedness. There is no history of foreign travel or exposure to ill friends or relatives. He has no significant animal exposures. There has been no exposure to undercooked or raw suspect foods. He has not taken antibiotics in the recent past 11/12/11 he began to have some head congestion and sore throat. The sore throat resolved with aspirin. He denies frontal headache, facial pain, nasal purulence. or dental pain. He's had a cough for the last 2 weeks which is loose; he is not producing sputum for observation        Objective:   Physical Exam General appearance : adequate nourishment w/o distress.  Eyes: No conjunctival inflammation or scleral icterus is present.  Oral exam: Dental hygiene is good; lips and gums are healthy appearing.There is no oropharyngeal erythema or exudate noted. Upper partial. Tongue is coated  Heart:  Normal rate and regular rhythm. S1 and S2 normal without gallop, murmur, click, rub . S 4 w/o other extra sounds. Some respiratory variation to the cardiac rhythm    Lungs: Loose, nonproductive cough. Initial story rub right lower lobe which resolves. Dry rales at the bases.No increased work of breathing.   Abdomen: bowel sounds normal, soft and non-tender without masses, organomegaly or hernias noted.  No guarding or rebound    Skin:Warm & dry.  Intact without suspicious lesions or rashes ; no jaundice or tenting. Scattered irregular vitiligo  Lymphatic: No lymphadenopathy is noted about the head, neck, axilla, or inguinal areas.              Assessment & Plan:  #1 acute diarrheal illness  #2 head congestion and cough without criteria for rhinosinusitis  #3 past history of diverticular rupture with partial colectomy  Plan: See orders & recommendations

## 2011-11-14 LAB — BASIC METABOLIC PANEL
BUN: 27 mg/dL — ABNORMAL HIGH (ref 6–23)
CO2: 28 mEq/L (ref 19–32)
Chloride: 103 mEq/L (ref 96–112)
Glucose, Bld: 98 mg/dL (ref 70–99)
Potassium: 5.3 mEq/L — ABNORMAL HIGH (ref 3.5–5.1)

## 2011-11-14 LAB — CBC WITH DIFFERENTIAL/PLATELET
Hemoglobin: 13.5 g/dL (ref 13.0–17.0)
RBC: 3.97 Mil/uL — ABNORMAL LOW (ref 4.22–5.81)

## 2011-11-28 ENCOUNTER — Other Ambulatory Visit (INDEPENDENT_AMBULATORY_CARE_PROVIDER_SITE_OTHER): Payer: Medicare Other

## 2011-11-28 DIAGNOSIS — N289 Disorder of kidney and ureter, unspecified: Secondary | ICD-10-CM

## 2011-11-28 LAB — BUN: BUN: 27 mg/dL — ABNORMAL HIGH (ref 6–23)

## 2011-11-28 NOTE — Progress Notes (Signed)
Labs only

## 2011-11-29 ENCOUNTER — Telehealth: Payer: Self-pay | Admitting: *Deleted

## 2011-11-29 LAB — POTASSIUM: Potassium: 4.9 mEq/L (ref 3.5–5.1)

## 2011-11-29 NOTE — Telephone Encounter (Signed)
Discuss with patient, wife.

## 2011-11-29 NOTE — Telephone Encounter (Signed)
Because of low platelet count, avoid excess aspirin , ibuprofen, naproxen etc .Repeat platelet count if  any abnormal bruising or bleeding occur. Low platelet count is been present for at least 4 years and is relatively stable.

## 2011-11-29 NOTE — Telephone Encounter (Signed)
Pt wife states that Pt was seen in office on yesterday and Dr Alwyn Ren advise that Pt Platelets were low. Pt wife would like to know if there is any additional f/u they need to do in regard to this.Please advise

## 2012-01-25 ENCOUNTER — Ambulatory Visit (INDEPENDENT_AMBULATORY_CARE_PROVIDER_SITE_OTHER): Payer: Medicare Other

## 2012-01-25 DIAGNOSIS — Z23 Encounter for immunization: Secondary | ICD-10-CM

## 2012-11-20 ENCOUNTER — Other Ambulatory Visit: Payer: Self-pay

## 2012-12-03 ENCOUNTER — Encounter: Payer: Self-pay | Admitting: Internal Medicine

## 2013-01-22 ENCOUNTER — Ambulatory Visit (INDEPENDENT_AMBULATORY_CARE_PROVIDER_SITE_OTHER): Payer: Medicare Other

## 2013-01-22 DIAGNOSIS — Z23 Encounter for immunization: Secondary | ICD-10-CM

## 2013-05-23 ENCOUNTER — Other Ambulatory Visit: Payer: Self-pay | Admitting: Internal Medicine

## 2013-06-04 ENCOUNTER — Other Ambulatory Visit (HOSPITAL_COMMUNITY): Payer: Self-pay | Admitting: Internal Medicine

## 2013-06-04 DIAGNOSIS — N184 Chronic kidney disease, stage 4 (severe): Secondary | ICD-10-CM

## 2013-06-05 ENCOUNTER — Ambulatory Visit (HOSPITAL_COMMUNITY)
Admission: RE | Admit: 2013-06-05 | Discharge: 2013-06-05 | Disposition: A | Discharge status: Home / Self Care | Payer: Medicare Other | Source: Ambulatory Visit | Attending: Cardiovascular Disease | Admitting: Cardiovascular Disease

## 2013-06-05 ENCOUNTER — Ambulatory Visit (HOSPITAL_BASED_OUTPATIENT_CLINIC_OR_DEPARTMENT_OTHER)
Admission: RE | Admit: 2013-06-05 | Discharge: 2013-06-05 | Disposition: A | Discharge status: Home / Self Care | Payer: Medicare Other | Source: Ambulatory Visit | Attending: Cardiovascular Disease | Admitting: Cardiovascular Disease

## 2013-06-05 DIAGNOSIS — N184 Chronic kidney disease, stage 4 (severe): Secondary | ICD-10-CM

## 2013-06-05 DIAGNOSIS — I129 Hypertensive chronic kidney disease with stage 1 through stage 4 chronic kidney disease, or unspecified chronic kidney disease: Secondary | ICD-10-CM

## 2013-06-05 DIAGNOSIS — K559 Vascular disorder of intestine, unspecified: Secondary | ICD-10-CM

## 2013-06-05 DIAGNOSIS — K5790 Diverticulosis of intestine, part unspecified, without perforation or abscess without bleeding: Secondary | ICD-10-CM

## 2013-06-05 DIAGNOSIS — N189 Chronic kidney disease, unspecified: Secondary | ICD-10-CM | POA: Insufficient documentation

## 2013-06-05 NOTE — Progress Notes (Signed)
Abdominal Aorta Mesenteric Duplex Completed. Oda Cogan, BS, RDMS, RVT

## 2013-06-05 NOTE — Progress Notes (Signed)
Renal Duplex Completed. Jobanny Mavis, BS, RDMS, RVT  

## 2013-06-10 ENCOUNTER — Inpatient Hospital Stay (HOSPITAL_COMMUNITY)
Admission: EM | Admit: 2013-06-10 | Discharge: 2013-06-16 | DRG: 689 | Disposition: A | Discharge status: Home Health | Payer: Medicare Other | Attending: Internal Medicine | Admitting: Internal Medicine

## 2013-06-10 DIAGNOSIS — N179 Acute kidney failure, unspecified: Secondary | ICD-10-CM

## 2013-06-10 DIAGNOSIS — Z8546 Personal history of malignant neoplasm of prostate: Secondary | ICD-10-CM

## 2013-06-10 DIAGNOSIS — I4891 Unspecified atrial fibrillation: Secondary | ICD-10-CM

## 2013-06-10 DIAGNOSIS — N390 Urinary tract infection, site not specified: Secondary | ICD-10-CM

## 2013-06-10 DIAGNOSIS — E860 Dehydration: Secondary | ICD-10-CM | POA: Diagnosis present

## 2013-06-10 DIAGNOSIS — N10 Acute tubulo-interstitial nephritis: Secondary | ICD-10-CM

## 2013-06-10 DIAGNOSIS — Z87891 Personal history of nicotine dependence: Secondary | ICD-10-CM

## 2013-06-10 DIAGNOSIS — Z9049 Acquired absence of other specified parts of digestive tract: Secondary | ICD-10-CM

## 2013-06-10 DIAGNOSIS — Z66 Do not resuscitate: Secondary | ICD-10-CM | POA: Diagnosis not present

## 2013-06-10 DIAGNOSIS — R509 Fever, unspecified: Secondary | ICD-10-CM

## 2013-06-10 DIAGNOSIS — A498 Other bacterial infections of unspecified site: Secondary | ICD-10-CM | POA: Diagnosis present

## 2013-06-10 DIAGNOSIS — R109 Unspecified abdominal pain: Secondary | ICD-10-CM

## 2013-06-10 DIAGNOSIS — R0989 Other specified symptoms and signs involving the circulatory and respiratory systems: Secondary | ICD-10-CM | POA: Diagnosis present

## 2013-06-10 DIAGNOSIS — Z85038 Personal history of other malignant neoplasm of large intestine: Secondary | ICD-10-CM

## 2013-06-10 DIAGNOSIS — N189 Chronic kidney disease, unspecified: Secondary | ICD-10-CM

## 2013-06-10 DIAGNOSIS — R1110 Vomiting, unspecified: Secondary | ICD-10-CM

## 2013-06-10 DIAGNOSIS — N184 Chronic kidney disease, stage 4 (severe): Secondary | ICD-10-CM | POA: Diagnosis present

## 2013-06-10 DIAGNOSIS — Z79899 Other long term (current) drug therapy: Secondary | ICD-10-CM

## 2013-06-10 DIAGNOSIS — A419 Sepsis, unspecified organism: Secondary | ICD-10-CM | POA: Diagnosis present

## 2013-06-10 DIAGNOSIS — D72819 Decreased white blood cell count, unspecified: Secondary | ICD-10-CM | POA: Diagnosis present

## 2013-06-10 DIAGNOSIS — K5790 Diverticulosis of intestine, part unspecified, without perforation or abscess without bleeding: Secondary | ICD-10-CM

## 2013-06-10 DIAGNOSIS — D696 Thrombocytopenia, unspecified: Secondary | ICD-10-CM | POA: Diagnosis present

## 2013-06-10 DIAGNOSIS — J438 Other emphysema: Secondary | ICD-10-CM | POA: Diagnosis present

## 2013-06-10 DIAGNOSIS — J189 Pneumonia, unspecified organism: Secondary | ICD-10-CM | POA: Diagnosis present

## 2013-06-10 DIAGNOSIS — G9340 Encephalopathy, unspecified: Secondary | ICD-10-CM | POA: Diagnosis present

## 2013-06-10 HISTORY — DX: Emphysema, unspecified: J43.9

## 2013-06-10 NOTE — ED Notes (Signed)
Bed: PS88 Expected date: 06/10/13 Expected time: 11:20 PM Means of arrival: Ambulance Comments: Fever, nausea

## 2013-06-10 NOTE — ED Notes (Signed)
Per EMS report: pt from home: pt began to feel bad 2-3 days ago and is unable to retain fluids. Family reports a fever of 101. Pt has been vomiting on and off but the last episode of emesis was earlier today.  Pt a/o x 4 but hard of hearing.

## 2013-06-11 ENCOUNTER — Emergency Department (HOSPITAL_COMMUNITY): Payer: Medicare Other

## 2013-06-11 ENCOUNTER — Encounter (HOSPITAL_COMMUNITY): Payer: Self-pay | Admitting: Emergency Medicine

## 2013-06-11 DIAGNOSIS — D696 Thrombocytopenia, unspecified: Secondary | ICD-10-CM | POA: Diagnosis present

## 2013-06-11 DIAGNOSIS — N10 Acute tubulo-interstitial nephritis: Secondary | ICD-10-CM | POA: Diagnosis present

## 2013-06-11 DIAGNOSIS — N189 Chronic kidney disease, unspecified: Secondary | ICD-10-CM

## 2013-06-11 DIAGNOSIS — R509 Fever, unspecified: Secondary | ICD-10-CM

## 2013-06-11 DIAGNOSIS — G9340 Encephalopathy, unspecified: Secondary | ICD-10-CM | POA: Diagnosis present

## 2013-06-11 DIAGNOSIS — R109 Unspecified abdominal pain: Secondary | ICD-10-CM

## 2013-06-11 DIAGNOSIS — J189 Pneumonia, unspecified organism: Secondary | ICD-10-CM | POA: Diagnosis present

## 2013-06-11 DIAGNOSIS — N179 Acute kidney failure, unspecified: Secondary | ICD-10-CM | POA: Diagnosis present

## 2013-06-11 DIAGNOSIS — K5730 Diverticulosis of large intestine without perforation or abscess without bleeding: Secondary | ICD-10-CM

## 2013-06-11 LAB — CBC
HCT: 39.3 % (ref 39.0–52.0)
Hemoglobin: 13.3 g/dL (ref 13.0–17.0)
MCH: 33.6 pg (ref 26.0–34.0)
MCHC: 33.8 g/dL (ref 30.0–36.0)
MCV: 99.2 fL (ref 78.0–100.0)
PLATELETS: 60 10*3/uL — AB (ref 150–400)
RBC: 3.96 MIL/uL — ABNORMAL LOW (ref 4.22–5.81)
RDW: 13.6 % (ref 11.5–15.5)
WBC: 3.3 10*3/uL — AB (ref 4.0–10.5)

## 2013-06-11 LAB — COMPREHENSIVE METABOLIC PANEL
ALT: 16 U/L (ref 0–53)
AST: 41 U/L — ABNORMAL HIGH (ref 0–37)
Albumin: 3.7 g/dL (ref 3.5–5.2)
Alkaline Phosphatase: 110 U/L (ref 39–117)
BUN: 43 mg/dL — ABNORMAL HIGH (ref 6–23)
CALCIUM: 10.3 mg/dL (ref 8.4–10.5)
CHLORIDE: 100 meq/L (ref 96–112)
CO2: 23 meq/L (ref 19–32)
CREATININE: 2.83 mg/dL — AB (ref 0.50–1.35)
GFR, EST AFRICAN AMERICAN: 22 mL/min — AB (ref >90)
GFR, EST NON AFRICAN AMERICAN: 19 mL/min — AB (ref >90)
GLUCOSE: 157 mg/dL — AB (ref 70–99)
Potassium: 4.6 mEq/L (ref 3.7–5.3)
Sodium: 141 mEq/L (ref 137–147)
Total Bilirubin: 1.4 mg/dL — ABNORMAL HIGH (ref 0.3–1.2)
Total Protein: 7.5 g/dL (ref 6.0–8.3)

## 2013-06-11 LAB — BASIC METABOLIC PANEL
BUN: 45 mg/dL — ABNORMAL HIGH (ref 6–23)
CALCIUM: 9.5 mg/dL (ref 8.4–10.5)
CHLORIDE: 97 meq/L (ref 96–112)
CO2: 20 mEq/L (ref 19–32)
CREATININE: 2.82 mg/dL — AB (ref 0.50–1.35)
GFR calc non Af Amer: 19 mL/min — ABNORMAL LOW (ref >90)
GFR, EST AFRICAN AMERICAN: 22 mL/min — AB (ref >90)
Glucose, Bld: 150 mg/dL — ABNORMAL HIGH (ref 70–99)
Potassium: 4.5 mEq/L (ref 3.7–5.3)
Sodium: 136 mEq/L — ABNORMAL LOW (ref 137–147)

## 2013-06-11 LAB — CBC WITH DIFFERENTIAL/PLATELET
BASOS ABS: 0 10*3/uL (ref 0.0–0.1)
Basophils Relative: 0 % (ref 0–1)
EOS PCT: 0 % (ref 0–5)
Eosinophils Absolute: 0 10*3/uL (ref 0.0–0.7)
HCT: 39.6 % (ref 39.0–52.0)
HEMOGLOBIN: 13.4 g/dL (ref 13.0–17.0)
LYMPHS ABS: 0.2 10*3/uL — AB (ref 0.7–4.0)
Lymphocytes Relative: 2 % — ABNORMAL LOW (ref 12–46)
MCH: 33.5 pg (ref 26.0–34.0)
MCHC: 33.8 g/dL (ref 30.0–36.0)
MCV: 99 fL (ref 78.0–100.0)
MONO ABS: 0.9 10*3/uL (ref 0.1–1.0)
MONOS PCT: 8 % (ref 3–12)
Neutro Abs: 9.8 10*3/uL — ABNORMAL HIGH (ref 1.7–7.7)
Neutrophils Relative %: 90 % — ABNORMAL HIGH (ref 43–77)
Platelets: 78 10*3/uL — ABNORMAL LOW (ref 150–400)
RBC: 4 MIL/uL — AB (ref 4.22–5.81)
RDW: 13.6 % (ref 11.5–15.5)
WBC: 10.8 10*3/uL — ABNORMAL HIGH (ref 4.0–10.5)

## 2013-06-11 LAB — URINALYSIS, ROUTINE W REFLEX MICROSCOPIC
Bilirubin Urine: NEGATIVE
GLUCOSE, UA: NEGATIVE mg/dL
KETONES UR: NEGATIVE mg/dL
Leukocytes, UA: NEGATIVE
Nitrite: NEGATIVE
PROTEIN: NEGATIVE mg/dL
Specific Gravity, Urine: 1.004 — ABNORMAL LOW (ref 1.005–1.030)
Urobilinogen, UA: 0.2 mg/dL (ref 0.0–1.0)
pH: 6.5 (ref 5.0–8.0)

## 2013-06-11 LAB — LIPASE, BLOOD: Lipase: 16 U/L (ref 11–59)

## 2013-06-11 LAB — TROPONIN I

## 2013-06-11 LAB — URINE MICROSCOPIC-ADD ON

## 2013-06-11 LAB — MAGNESIUM: Magnesium: 1.9 mg/dL (ref 1.5–2.5)

## 2013-06-11 LAB — I-STAT CG4 LACTIC ACID, ED: Lactic Acid, Venous: 1.75 mmol/L (ref 0.5–2.2)

## 2013-06-11 LAB — PHOSPHORUS: PHOSPHORUS: 5 mg/dL — AB (ref 2.3–4.6)

## 2013-06-11 MED ORDER — HYDROCODONE-ACETAMINOPHEN 5-325 MG PO TABS: 1.0000 | ORAL_TABLET | Freq: Four times a day (QID) | ORAL | Status: DC | PRN

## 2013-06-11 MED ORDER — IOHEXOL 300 MG/ML  SOLN
50.0000 mL | Freq: Once | INTRAMUSCULAR | Status: AC | PRN
  Administered 2013-06-11: 50 mL via ORAL

## 2013-06-11 MED ORDER — ONDANSETRON HCL 4 MG PO TABS
4.0000 mg | ORAL_TABLET | Freq: Four times a day (QID) | ORAL | Status: DC | PRN
  Administered 2013-06-11: 4 mg via ORAL
  Filled 2013-06-11: qty 1

## 2013-06-11 MED ORDER — OMEPRAZOLE MAGNESIUM 20 MG PO TBEC: 20.0000 mg | DELAYED_RELEASE_TABLET | Freq: Every day | ORAL | Status: DC

## 2013-06-11 MED ORDER — ONDANSETRON HCL 4 MG/2ML IJ SOLN: 4.0000 mg | Freq: Once | INTRAMUSCULAR | Status: DC

## 2013-06-11 MED ORDER — SODIUM CHLORIDE 0.9 % IV BOLUS (SEPSIS)
1000.0000 mL | Freq: Once | INTRAVENOUS | Status: AC
  Administered 2013-06-11: 1000 mL via INTRAVENOUS

## 2013-06-11 MED ORDER — LEVOFLOXACIN IN D5W 750 MG/150ML IV SOLN
750.0000 mg | Freq: Once | INTRAVENOUS | Status: AC
  Administered 2013-06-11: 750 mg via INTRAVENOUS
  Filled 2013-06-11: qty 150

## 2013-06-11 MED ORDER — PANTOPRAZOLE SODIUM 40 MG PO TBEC
40.0000 mg | DELAYED_RELEASE_TABLET | Freq: Every day | ORAL | Status: DC
  Administered 2013-06-12 – 2013-06-16 (×5): 40 mg via ORAL
  Filled 2013-06-11 (×5): qty 1

## 2013-06-11 MED ORDER — ENOXAPARIN SODIUM 40 MG/0.4ML ~~LOC~~ SOLN
40.0000 mg | SUBCUTANEOUS | Status: DC
  Administered 2013-06-11: 40 mg via SUBCUTANEOUS
  Filled 2013-06-11: qty 0.4

## 2013-06-11 MED ORDER — SODIUM CHLORIDE 0.9 % IJ SOLN
3.0000 mL | Freq: Two times a day (BID) | INTRAMUSCULAR | Status: DC
  Administered 2013-06-13 – 2013-06-16 (×4): 3 mL via INTRAVENOUS

## 2013-06-11 MED ORDER — HYDROMORPHONE HCL PF 1 MG/ML IJ SOLN: 1.0000 mg | INTRAMUSCULAR | Status: DC | PRN

## 2013-06-11 MED ORDER — SODIUM CHLORIDE 0.9 % IV SOLN: INTRAVENOUS | Status: AC

## 2013-06-11 MED ORDER — ONDANSETRON HCL 4 MG/2ML IJ SOLN: 4.0000 mg | Freq: Four times a day (QID) | INTRAMUSCULAR | Status: DC | PRN

## 2013-06-11 MED ORDER — SIMVASTATIN 20 MG PO TABS
20.0000 mg | ORAL_TABLET | Freq: Every day | ORAL | Status: DC
  Administered 2013-06-12 – 2013-06-16 (×5): 20 mg via ORAL
  Filled 2013-06-11 (×6): qty 1

## 2013-06-11 MED ORDER — PIPERACILLIN-TAZOBACTAM 3.375 G IVPB
3.3750 g | Freq: Three times a day (TID) | INTRAVENOUS | Status: DC
  Administered 2013-06-11 – 2013-06-12 (×4): 3.375 g via INTRAVENOUS
  Filled 2013-06-11 (×7): qty 50

## 2013-06-11 MED ORDER — SODIUM CHLORIDE 0.9 % IV SOLN
INTRAVENOUS | Status: DC
  Administered 2013-06-11: 06:00:00 via INTRAVENOUS

## 2013-06-11 MED ORDER — HYDROMORPHONE HCL PF 1 MG/ML IJ SOLN: 0.5000 mg | INTRAMUSCULAR | Status: DC | PRN

## 2013-06-11 MED ORDER — HYDROCODONE-ACETAMINOPHEN 5-325 MG PO TABS: 1.0000 | ORAL_TABLET | ORAL | Status: DC | PRN

## 2013-06-11 MED ORDER — ACETAMINOPHEN 325 MG PO TABS
650.0000 mg | ORAL_TABLET | Freq: Once | ORAL | Status: AC
  Administered 2013-06-11: 650 mg via ORAL
  Filled 2013-06-11: qty 2

## 2013-06-11 NOTE — ED Notes (Signed)
Pt was able to urinate but, it was not enough to use for a sample. RN notified

## 2013-06-11 NOTE — Evaluation (Signed)
Physical Therapy Evaluation Patient Details Name: Prathik Aman Roskos MRN: [id number] DOB: 08-Oct-1928 Today's Date: 06/11/2013 Time: 2993-7169 PT Time Calculation (min): 16 min  PT Assessment / Plan / Recommendation History of Present Illness  80 yo admitted with fever, nausea. Hx of emphysema, prostate cancer, colon rescetion.   Clinical Impression  On eval, pt required Mod assist for mobility (+2 for safety)-able to ambulate ~75 feet with walker. Pt appeared confused-wife had to correct a lot of erroneous information pt was providing. Recommend SNF at this time. Pt may be able to d/c home with 24/7 care if cognition and mobility improve significantly.     PT Assessment  Patient needs continued PT services    Follow Up Recommendations  SNF (depending on progress)    Does the patient have the potential to tolerate intense rehabilitation      Barriers to Discharge        Equipment Recommendations  Rolling walker with 5" wheels (possibly)    Recommendations for Other Services OT consult   Frequency Min 3X/week    Precautions / Restrictions Precautions Precautions: Fall Restrictions Weight Bearing Restrictions: No   Pertinent Vitals/Pain No c/o pain      Mobility  Bed Mobility Overal bed mobility: Needs Assistance Bed Mobility: Supine to Sit;Sit to Supine Supine to sit: Min guard Sit to supine: Min guard General bed mobility comments: close guard for safety Transfers Overall transfer level: Needs assistance Equipment used: Rolling walker (2 wheeled) Transfers: Sit to/from Stand Sit to Stand: Mod assist;From elevated surface General transfer comment: Assist to rise, stabilize, control descent. VCs safety, hand placement.  Ambulation/Gait Ambulation/Gait assistance: Mod assist Ambulation Distance (Feet): 75 Feet Assistive device: Rolling walker (2 wheeled) Gait Pattern/deviations: Step-through pattern;Trunk flexed;Decreased stride length General Gait Details: assist  to support/stabilize pt and to maneuver safely with walker. fatigues fairly easily. VCS safety, distance from walker. O2 sats 88% during ambulation. 2 brief standing rest breaks needed for pt to be able to continue. Unsteady.    Exercises     PT Diagnosis: Difficulty walking;Generalized weakness;Altered mental status  PT Problem List: Decreased strength;Decreased activity tolerance;Decreased balance;Decreased mobility;Decreased knowledge of use of DME;Decreased safety awareness PT Treatment Interventions: DME instruction;Gait training;Functional mobility training;Therapeutic activities;Therapeutic exercise;Balance training;Patient/family education     PT Goals(Current goals can be found in the care plan section) Acute Rehab PT Goals Patient Stated Goal: to get up to floor from ED PT Goal Formulation: With family Time For Goal Achievement: 06/25/13 Potential to Achieve Goals: Good  Visit Information  Last PT Received On: 06/11/13 Assistance Needed: +2 (safety) History of Present Illness: 80 yo admitted with fever, nausea. Hx of emphysema, prostate cancer, colon rescetion.        Prior [place] expects to be discharged to:: Private residence Living Arrangements: Spouse/significant other Available Help at Discharge: Family Prior Function Level of Independence: Independent Communication Communication: No difficulties    Cognition  Cognition Arousal/Alertness: Awake/alert Behavior During Therapy: WFL for tasks assessed/performed Overall Cognitive Status: Impaired/Different from baseline Area of Impairment: Memory;Safety/judgement;Following commands;Orientation Orientation Level: Time Following Commands: Follows one step commands with increased time Safety/Judgement: Decreased awareness of safety    Extremity/Trunk Assessment Upper Extremity Assessment Upper Extremity Assessment: Generalized weakness Lower Extremity Assessment Lower Extremity  Assessment: Generalized weakness Cervical / Trunk Assessment Cervical / Trunk Assessment: Kyphotic   Balance Balance Overall balance assessment: Needs assistance Sitting-balance support: Bilateral upper extremity supported;Feet supported Sitting balance-Leahy Scale: Good Standing balance support: Bilateral upper extremity supported;During functional  activity Standing balance-Leahy Scale: Fair  End of Session PT - End of Session Equipment Utilized During Treatment: Gait belt Activity Tolerance: Patient limited by fatigue Patient left: in bed;with call bell/phone within reach;with family/visitor present  GP     Weston Anna, MPT Pager: [phone]

## 2013-06-11 NOTE — ED Provider Notes (Signed)
CSN: [id number]     Arrival date & time 06/10/13  2350 History   First MD Initiated Contact with Patient 06/10/13 2358     Chief Complaint  Patient presents with  . Emesis  . Fever     (Consider location/radiation/quality/duration/timing/severity/associated sxs/prior Treatment) HPI Comments: Derrick [name] is a 80 year-old male with a past medical history of emphysema, prostate cancer, renal cysts, thrombocytopenia, colon resection with reastomosis, presenting the Emergency Department with a chief complaint of vomiting for 2 days.  The patient reports multiple episodes of non-bloody emesis for two days.  He reports he was able to keep some fluids and crackers down yesterday but is unable to tolerate oral intake today. His wife reports intermittent fever, home temperature 101.  He denies taking ibuprofen or tylenol. He denies dark or black stools, BRBPR. .  Denies chest pain, shortness of breath. PCP: Jerlyn Ly, MD   Patient is a 80 y.o. male presenting with vomiting and fever. The history is provided by the patient, the spouse and medical records. No language interpreter was used.  Emesis Associated symptoms: abdominal pain   Fever Associated symptoms: cough, nausea and vomiting   Associated symptoms: no chest pain, no confusion and no rash     Past Medical History  Diagnosis Date  . Emphysema lung    Past Surgical History  Procedure Laterality Date  . Prostatectomy  2001    Dr Risa Grill  . Abdominal aortic aneurysm repair  2001  . Colectomy  2005    diverticular rupture  . Colon reastomosis  2005   No family history on file. History  Substance Use Topics  . Smoking status: Former Smoker    Quit date: 04/18/1999  . Smokeless tobacco: Not on file  . Alcohol Use: Yes     Comment: Beer-seldom     Review of Systems  Constitutional: Positive for fever.  Respiratory: Positive for cough. Negative for chest tightness and shortness of breath.   Cardiovascular: Negative for  chest pain, palpitations and leg swelling.  Gastrointestinal: Positive for nausea, vomiting and abdominal pain. Negative for blood in stool, abdominal distention and anal bleeding.  Skin: Negative for rash.  Psychiatric/Behavioral: Negative for confusion.      Allergies  Review of patient's allergies indicates no known allergies.  Home Medications   Current Outpatient Rx  Name  Route  Sig  Dispense  Refill  . omeprazole (PRILOSEC OTC) 20 MG tablet   Oral   Take 20 mg by mouth daily.         . simvastatin (ZOCOR) 20 MG tablet   Oral   Take 20 mg by mouth daily.          BP 130/78  Pulse 83  Temp(Src) 98.6 F (37 C) (Oral)  Resp 23  SpO2 96% Physical Exam  Nursing note and vitals reviewed. Constitutional: He is oriented to person, place, and time. He appears well-developed and well-nourished. No distress.  HENT:  Head: Normocephalic and atraumatic.  Neck: Neck supple.  Cardiovascular: Regular rhythm.  Tachycardia present.   Pulmonary/Chest: Tachypnea noted. No respiratory distress. He has decreased breath sounds in the left middle field and the left lower field. He has rhonchi.  Abdominal: Soft. Normal appearance. There is tenderness in the right lower quadrant, suprapubic area and left lower quadrant. There is no rigidity and no guarding.  Musculoskeletal: Normal range of motion.  Neurological: He is alert and oriented to person, place, and time.  Skin: Skin is warm and  dry. He is not diaphoretic.  Psychiatric: He has a normal mood and affect. His behavior is normal. Thought content normal.    ED Course  Procedures (including critical care time) Labs Review Labs Reviewed  CBC WITH DIFFERENTIAL - Abnormal; Notable for the following:    WBC 10.8 (*)    RBC 4.00 (*)    Platelets 78 (*)    Neutrophils Relative % 90 (*)    Neutro Abs 9.8 (*)    Lymphocytes Relative 2 (*)    Lymphs Abs 0.2 (*)    All other components within normal limits  COMPREHENSIVE METABOLIC  PANEL - Abnormal; Notable for the following:    Glucose, Bld 157 (*)    BUN 43 (*)    Creatinine, Ser 2.83 (*)    AST 41 (*)    Total Bilirubin 1.4 (*)    GFR calc non Af Amer 19 (*)    GFR calc Af Amer 22 (*)    All other components within normal limits  URINALYSIS, ROUTINE W REFLEX MICROSCOPIC - Abnormal; Notable for the following:    Specific Gravity, Urine 1.004 (*)    Hgb urine dipstick SMALL (*)    All other components within normal limits  URINE MICROSCOPIC-ADD ON - Abnormal; Notable for the following:    Squamous Epithelial / LPF FEW (*)    Bacteria, UA MANY (*)    All other components within normal limits  CULTURE, BLOOD (ROUTINE X 2)  CULTURE, BLOOD (ROUTINE X 2)  URINE CULTURE  LIPASE, BLOOD  TROPONIN I  I-STAT CG4 LACTIC ACID, ED   Imaging Review Ct Abdomen Pelvis Wo Contrast  06/11/2013   CLINICAL DATA:  Fever, nausea and vomiting.  Leukocytosis.  EXAM: CT ABDOMEN AND PELVIS WITHOUT CONTRAST  TECHNIQUE: Multidetector CT imaging of the abdomen and pelvis was performed following the standard protocol without intravenous contrast.  COMPARISON:  CT of the abdomen and pelvis performed 07/25/2010  FINDINGS: Mild focal nodular opacity at the right lung base may reflect a mild infectious process; an underlying suspicious nodule cannot be excluded, but is not well characterized due to motion artifact.  A small-to-moderate hiatal hernia is seen.  The liver and spleen are unremarkable in appearance. The gallbladder is within normal limits. The pancreas and adrenal glands are unremarkable.  Numerous large bilateral renal cysts are seen. Nonspecific perinephric stranding is noted on the left side. This raises concern for left-sided pyelonephritis. Mild bilateral renal atrophy is noted. There is no evidence of hydronephrosis. No renal or ureteral stones are seen.  No free fluid is identified. The small bowel is unremarkable in appearance. The stomach is within normal limits. No acute  vascular abnormalities are seen. Scattered calcification is seen along the abdominal aorta and its branches. The patient is status post aortoiliac stent graft. Prominent bilateral common femoral artery aneurysms are seen, measuring 4.1 cm on the right and 3.4 cm on the left.  The appendix is not definitely seen; there is no evidence for appendicitis. The colon is unremarkable in appearance.  The bladder is mildly distended and grossly unremarkable in appearance. The patient is status post prostatectomy. No inguinal lymphadenopathy is seen.  No acute osseous abnormalities are identified. Degenerative change is noted along the lumbar spine, with multilevel vacuum phenomenon.  IMPRESSION: 1. Nonspecific perinephric stranding noted about the left kidney, new from the prior study. This could reflect mild left-sided pyelonephritis. Would correlate clinically for associated symptoms. 2. Numerous large bilateral renal cysts again seen, with mild  bilateral renal atrophy. 3. Mild focal nodular opacity at the right lung base may reflect a mild infectious process; an underlying suspicious nodule cannot be excluded, but is not well characterized due to motion artifact. CT of the chest could be considered for further evaluation, on an elective nonemergent basis. 4. Small to moderate hiatal hernia seen. 5. Per prominent bilateral common femoral arteries noted, measuring 4.1 cm on the right and 3.4 cm on the left. These have increased in size from 2012.   Electronically Signed   By: Garald Balding M.D.   On: 06/11/2013 04:17   Dg Chest 2 View  06/11/2013   CLINICAL DATA:  80 year old male with hypoxia.  EXAM: CHEST  2 VIEW  COMPARISON:  11/13/2011  FINDINGS: Cardiomegaly noted.  A moderate hiatal hernia again identified.  There is no evidence of focal airspace disease, pulmonary edema, suspicious pulmonary nodule/mass, pleural effusion, or pneumothorax. No acute bony abnormalities are identified.  Mid thoracic compression  fractures are unchanged.  IMPRESSION: Cardiomegaly without evidence of acute cardiopulmonary disease.  Moderate hiatal hernia.   Electronically Signed   By: Hassan Rowan M.D.   On: 06/11/2013 01:21   Dg Abd 2 Views  06/11/2013   CLINICAL DATA:  Vomiting and abdominal pain.  EXAM: ABDOMEN - 2 VIEW  COMPARISON:  Abdominal radiograph performed 07/25/2010  FINDINGS: The visualized bowel gas pattern is unremarkable. Scattered air and stool filled loops of colon are seen; no abnormal dilatation of small bowel loops is seen to suggest small bowel obstruction. No free intra-abdominal air is identified on the provided decubitus view. Clips are noted within the right upper quadrant, reflecting prior cholecystectomy.  Mild left convex lumbar scoliosis is noted; the sacroiliac joints are unremarkable in appearance. The visualized lung bases are essentially clear.  IMPRESSION: Unremarkable bowel gas pattern; no free intra-abdominal air seen.   Electronically Signed   By: Garald Balding M.D.   On: 06/11/2013 01:39    EKG Interpretation    Date/Time:  Tuesday June 10 2013 23:55:09 EST Ventricular Rate:  100 PR Interval:  191 QRS Duration: 106 QT Interval:  356 QTC Calculation: 459 R Axis:   85 Text Interpretation:  Sinus tachycardia Probable LVH with secondary repol abnrm Inferior T wave inversions new Confirmed by ZAVITZ  MD, JOSHUA ([id number]) on 06/11/2013 12:20:38 AM            MDM   Final diagnoses:  Fever  Vomiting  UTI (lower urinary tract infection)  Abdominal pain  Acute kidney injury   Pt with vomiting for 2 days, fever 101 at home.  Generalized lower abdominal pain, tachycardic, tachypenic, decreased breath sounds on Left side on exam, oxygen saturation 90% RA.  Initial temperature 99.5 oral, rectal temp 103.5. Possible sepsis work-up initiated.  Given fever, oxygen saturation, and lung sounds on exam will treat for possible pneumonia.  Levaquin and bolus given.   EKG shows new inferior  T-wave inversions, troponin ordered. XR without pneumonia.  Abdomen without signs of an obstruction. CBC shows leukocytosis, and thrombocytopenia (pt has a history of thrombocytopenia per EMR).  CMP shows Cr 2.83, BUN 43, acute kidney injury likely dehydration.  Discussed patient history, condition, and labs with Dr. Reather Converse. After his evaluation of the patient advises CT of abdomen.  Will admit the patient for fever, abdominal pain, UTI, acute kidney injury, dehydration.  Dr. Olen Pel to assess the patient in the ED.  Meds given in ED:  Medications  piperacillin-tazobactam (ZOSYN) IVPB 3.375 g (not administered)  sodium chloride 0.9 % bolus 1,000 mL (0 mLs Intravenous Stopped 06/11/13 0301)  levofloxacin (LEVAQUIN) IVPB 750 mg (0 mg Intravenous Stopped 06/11/13 0301)  acetaminophen (TYLENOL) tablet 650 mg (650 mg Oral Given 06/11/13 0110)  iohexol (OMNIPAQUE) 300 MG/ML solution 50 mL (50 mLs Oral Contrast Given 06/11/13 0209)    New Prescriptions   No medications on file        Lorrine Kin, PA-C 06/11/13 [phone]

## 2013-06-11 NOTE — Progress Notes (Signed)
ANTIBIOTIC CONSULT NOTE - INITIAL  Pharmacy Consult for zosyn Indication: UTI  No Known Allergies  Patient Measurements:   Adjusted Body Weight:   Vital Signs: Temp: 98.6 F (37 C) (02/25 0252) Temp src: Oral (02/25 0252) BP: 130/78 mmHg (02/25 0252) Pulse Rate: 83 (02/25 0252) Intake/Output from previous day:   Intake/Output from this shift:    Labs:  Recent Labs  06/11/13 0020  WBC 10.8*  HGB 13.4  PLT 78*  CREATININE 2.83*   CrCl is unknown because there is no height on file for the current visit. No results found for this basename: VANCOTROUGH, VANCOPEAK, VANCORANDOM, GENTTROUGH, GENTPEAK, GENTRANDOM, TOBRATROUGH, TOBRAPEAK, TOBRARND, AMIKACINPEAK, AMIKACINTROU, AMIKACIN,  in the last 72 hours   Microbiology: No results found for this or any previous visit (from the past 720 hour(s)).  Medical History: Past Medical History  Diagnosis Date  . Emphysema lung     Medications:  Anti-infectives   Start     Dose/Rate Route Frequency Ordered Stop   06/11/13 0445  piperacillin-tazobactam (ZOSYN) IVPB 3.375 g     3.375 g 12.5 mL/hr over 240 Minutes Intravenous 3 times per day 06/11/13 0438     06/11/13 0100  levofloxacin (LEVAQUIN) IVPB 750 mg     750 mg 100 mL/hr over 90 Minutes Intravenous  Once 06/11/13 0056 06/11/13 0301     Assessment: Patient with UTI.    Goal of Therapy:  Zosyn based on renal function   Plan:  Zosyn 3.375g IV Q8H infused over 4hrs.   Tyler Deis, Shea Stakes Crowford 06/11/2013,5:06 AM

## 2013-06-11 NOTE — ED Notes (Signed)
Pt having uncontrollable shakes and feels warm to the touch.

## 2013-06-11 NOTE — Progress Notes (Signed)
PROGRESS NOTE    Derrick [name] GXQ:[id number] DOB: Mar 05, 1929 DOA: 06/10/2013 PCP: Jerlyn Ly, MD Urology: [name]  HPI/Brief narrative 80 year old male with history of emphysema, prostate cancer status post prostatectomy, presented with complaints of nausea, vomiting, nonproductive cough, abdominal pain,? Urinary frequency and urgency and fever. He was admitted for likely UTI/pyelonephritis and possible pneumonia.  Assessment/Plan:  1. UTI/possible left pyelonephritis: Continue IV Zosyn pending final urine culture results. 2. Possible community-acquired pneumonia: CT abdomen shows mild focal nodular opacity at the right lung base which may reflect a mild infectious process. Patient does have nonproductive cough which is new but no dyspnea. Continue empiric Zosyn. May need CT chest in a few weeks to ensure resolution that since it did not show up on chest x-ray. 3. Acute encephalopathy: Overnight, patient became confused and slightly agitated in the context of fevers. Likely secondary to febrile illness. Improved. 4. Acute on stage III chronic kidney disease: Baseline creatinine? 2.4. Likely secondary to dehydration. Hydration with IV fluids and follow BMP. 5. Acute on chronic thrombocytopenia: No bleeding. Likely precipitated by acute infection. Follow CBCs. Mild leukopenia also secondary to same. 6. History of emphysema: Stable 7. History of prostate cancer: Outpatient followup with urology   Code Status: Full Family Communication: Discussed with spouse at length at bedside. Disposition Plan: Home when medically stable   Consultants:  None  Procedures:  None  Antibiotics:  IV Zosyn 2/25 >   Subjective: Patient complains of mild mid abdominal pain, nonradiating and denies dysuria, urgency or urinary frequency at this time. Nonproductive cough but no dyspnea or chest pain. Confused overnight-as per spouse, mental status is improved but not yet at  baseline.  Objective: Filed Vitals:   06/11/13 0930 06/11/13 0944 06/11/13 1000 06/11/13 1030  BP: 111/67 106/65 105/68 113/96  Pulse: 92 91 92 88  Temp:      TempSrc:      Resp: 35 24 23 24   SpO2: 95% 96% 96% 94%   No intake or output data in the 24 hours ending 06/11/13 1101 There were no vitals filed for this visit.   Exam:  General exam: Elderly pleasant male lying comfortably in bed Respiratory system: Slightly reduced breath sounds in the bases but otherwise clear to auscultation. No increased work of breathing. Cardiovascular system: S1 & S2 heard, RRR. No JVD, murmurs, gallops, clicks or pedal edema. Gastrointestinal system: Abdomen is nondistended, soft and nontender. Normal bowel sounds heard. Central nervous system: Alert and oriented x2. No focal neurological deficits. Extremities: Symmetric 5 x 5 power.   Data Reviewed: Basic Metabolic Panel:  Recent Labs Lab 06/11/13 0020 06/11/13 0630  NA 141 136*  K 4.6 4.5  CL 100 97  CO2 23 20  GLUCOSE 157* 150*  BUN 43* 45*  CREATININE 2.83* 2.82*  CALCIUM 10.3 9.5  MG  --  1.9  PHOS  --  5.0*   Liver Function Tests:  Recent Labs Lab 06/11/13 0020  AST 41*  ALT 16  ALKPHOS 110  BILITOT 1.4*  PROT 7.5  ALBUMIN 3.7    Recent Labs Lab 06/11/13 0020  LIPASE 16   No results found for this basename: AMMONIA,  in the last 168 hours CBC:  Recent Labs Lab 06/11/13 0020 06/11/13 0630  WBC 10.8* 3.3*  NEUTROABS 9.8*  --   HGB 13.4 13.3  HCT 39.6 39.3  MCV 99.0 99.2  PLT 78* 60*   Cardiac Enzymes:  Recent Labs Lab 06/11/13 0020  TROPONINI <0.30  BNP (last 3 results) No results found for this basename: PROBNP,  in the last 8760 hours CBG: No results found for this basename: GLUCAP,  in the last 168 hours  No results found for this or any previous visit (from the past 240 hour(s)).    Studies: Ct Abdomen Pelvis Wo Contrast  06/11/2013   CLINICAL DATA:  Fever, nausea and vomiting.   Leukocytosis.  EXAM: CT ABDOMEN AND PELVIS WITHOUT CONTRAST  TECHNIQUE: Multidetector CT imaging of the abdomen and pelvis was performed following the standard protocol without intravenous contrast.  COMPARISON:  CT of the abdomen and pelvis performed 07/25/2010  FINDINGS: Mild focal nodular opacity at the right lung base may reflect a mild infectious process; an underlying suspicious nodule cannot be excluded, but is not well characterized due to motion artifact.  A small-to-moderate hiatal hernia is seen.  The liver and spleen are unremarkable in appearance. The gallbladder is within normal limits. The pancreas and adrenal glands are unremarkable.  Numerous large bilateral renal cysts are seen. Nonspecific perinephric stranding is noted on the left side. This raises concern for left-sided pyelonephritis. Mild bilateral renal atrophy is noted. There is no evidence of hydronephrosis. No renal or ureteral stones are seen.  No free fluid is identified. The small bowel is unremarkable in appearance. The stomach is within normal limits. No acute vascular abnormalities are seen. Scattered calcification is seen along the abdominal aorta and its branches. The patient is status post aortoiliac stent graft. Prominent bilateral common femoral artery aneurysms are seen, measuring 4.1 cm on the right and 3.4 cm on the left.  The appendix is not definitely seen; there is no evidence for appendicitis. The colon is unremarkable in appearance.  The bladder is mildly distended and grossly unremarkable in appearance. The patient is status post prostatectomy. No inguinal lymphadenopathy is seen.  No acute osseous abnormalities are identified. Degenerative change is noted along the lumbar spine, with multilevel vacuum phenomenon.  IMPRESSION: 1. Nonspecific perinephric stranding noted about the left kidney, new from the prior study. This could reflect mild left-sided pyelonephritis. Would correlate clinically for associated symptoms.  2. Numerous large bilateral renal cysts again seen, with mild bilateral renal atrophy. 3. Mild focal nodular opacity at the right lung base may reflect a mild infectious process; an underlying suspicious nodule cannot be excluded, but is not well characterized due to motion artifact. CT of the chest could be considered for further evaluation, on an elective nonemergent basis. 4. Small to moderate hiatal hernia seen. 5. Per prominent bilateral common femoral arteries noted, measuring 4.1 cm on the right and 3.4 cm on the left. These have increased in size from 2012.   Electronically Signed   By: Garald Balding M.D.   On: 06/11/2013 04:17   Dg Chest 2 View  06/11/2013   CLINICAL DATA:  80 year old male with hypoxia.  EXAM: CHEST  2 VIEW  COMPARISON:  11/13/2011  FINDINGS: Cardiomegaly noted.  A moderate hiatal hernia again identified.  There is no evidence of focal airspace disease, pulmonary edema, suspicious pulmonary nodule/mass, pleural effusion, or pneumothorax. No acute bony abnormalities are identified.  Mid thoracic compression fractures are unchanged.  IMPRESSION: Cardiomegaly without evidence of acute cardiopulmonary disease.  Moderate hiatal hernia.   Electronically Signed   By: Hassan Rowan M.D.   On: 06/11/2013 01:21   Dg Abd 2 Views  06/11/2013   CLINICAL DATA:  Vomiting and abdominal pain.  EXAM: ABDOMEN - 2 VIEW  COMPARISON:  Abdominal radiograph  performed 07/25/2010  FINDINGS: The visualized bowel gas pattern is unremarkable. Scattered air and stool filled loops of colon are seen; no abnormal dilatation of small bowel loops is seen to suggest small bowel obstruction. No free intra-abdominal air is identified on the provided decubitus view. Clips are noted within the right upper quadrant, reflecting prior cholecystectomy.  Mild left convex lumbar scoliosis is noted; the sacroiliac joints are unremarkable in appearance. The visualized lung bases are essentially clear.  IMPRESSION: Unremarkable bowel  gas pattern; no free intra-abdominal air seen.   Electronically Signed   By: Garald Balding M.D.   On: 06/11/2013 01:39        Scheduled Meds: . enoxaparin (LOVENOX) injection  40 mg Subcutaneous Q24H  . sodium chloride  3 mL Intravenous Q12H   Continuous Infusions: . sodium chloride 50 mL/hr at 06/11/13 0600  . piperacillin-tazobactam (ZOSYN)  IV 3.375 g (06/11/13 0504)    Active Problems:   Fever    Time spent: 38 minutes    Mareli Antunes, MD, FACP, FHM. [facility] Pager [phone]  If 7PM-7AM, please contact night-coverage [email or web address] Password [email or web address] 06/11/2013, 11:01 AM    LOS: 1 day

## 2013-06-11 NOTE — ED Provider Notes (Signed)
Medical screening examination/treatment/procedure(s) were conducted as a shared visit with non-physician practitioner(s) or resident and myself. I personally evaluated the patient during the encounter and agree with the findings and plan unless otherwise indicated.  I have personally reviewed any xrays and/ or EKG's with the provider and I agree with interpretation.  Vomiting and fever for 2-3 days. Pt feels generally unwell. Exam lungs rales left base, abd soft/ NT, dry mm, non toxic appearing, no rashes. Clinically pneumonia vs UTI. CXR reviewed, infiltrate left base. Community acquired abx. Fluid bolus. CT showed hernia and pyelonephritis. Admitted to [facility].  Labs Reviewed   CBC WITH DIFFERENTIAL - Abnormal; Notable for the following:    WBC  10.8 (*)     RBC  4.00 (*)     Platelets  78 (*)     Neutrophils Relative %  90 (*)     Neutro Abs  9.8 (*)     Lymphocytes Relative  2 (*)     Lymphs Abs  0.2 (*)     All other components within normal limits   COMPREHENSIVE METABOLIC PANEL - Abnormal; Notable for the following:    Glucose, Bld  157 (*)     BUN  43 (*)     Creatinine, Ser  2.83 (*)     AST  41 (*)     Total Bilirubin  1.4 (*)     GFR calc non Af Amer  19 (*)     GFR calc Af Amer  22 (*)     All other components within normal limits   URINALYSIS, ROUTINE W REFLEX MICROSCOPIC - Abnormal; Notable for the following:    Specific Gravity, Urine  1.004 (*)     Hgb urine dipstick  SMALL (*)     All other components within normal limits   URINE MICROSCOPIC-ADD ON - Abnormal; Notable for the following:    Squamous Epithelial / LPF  FEW (*)     Bacteria, UA  MANY (*)     All other components within normal limits   CULTURE, BLOOD (ROUTINE X 2)   CULTURE, BLOOD (ROUTINE X 2)   URINE CULTURE   LIPASE, BLOOD   TROPONIN I   I-STAT CG4 LACTIC ACID, ED    CAP, Pyelonephritis, Fever, Dehydration, ARF   Mariea Clonts, MD 06/11/13 [phone]

## 2013-06-11 NOTE — ED Notes (Signed)
Patient transported to CT 

## 2013-06-11 NOTE — ED Notes (Signed)
Dr Zavitz at bedside  

## 2013-06-11 NOTE — H&P (Addendum)
[facility] History and Physical  Derrick [name] [place] OIN:[id number] DOB: 10-Apr-1929 DOA: 06/10/2013  Referring physician: ED physician PCP: Jerlyn Ly, MD   Chief Complaint: fever  HPI:  80 year-old male with a past medical history of emphysema, prostate cancer, colon resection with reastomosis, presenting the Emergency Department with main concern of several days duration of fevers, T max 101 F, associated with lower abd discomfort, throbbing and 5/10 in severity, non radiating. No specific alleviating or aggravating factors, also associated with urinary urgency and frequency, nausea and on bloody vomiting, No recent sick contacts or exposures, no similar events in the past.  In ED, Tmax 103 F, pt's UA worrisome for UTI, started on Levaquin and TRH asked to admit to telemetry bed.    Assessment and Plan: Active Problems:   Fever - with nausea and vomiting, ? pyelo  - possibly related to UTI, CT abdomen is pending - will follow up on urine culture - will start Zosyn for now - place on IVF, analgesia and antiemetics as needed - follow upon blood cultures as well     Acute renal failure - likely pre renal - will place on IVF and repeat BMP in AM   Thrombocytopenia - no signs of active bleeding - Lovenox for DVT prophylaxis - repeat CBC in AM  Code Status: Full Family Communication: Pt and wife at bedside Disposition Plan: Admit to telemetry     Review of Systems:  Constitutional: Negative for diaphoresis.  HENT: Negative for hearing loss, ear pain, nosebleeds, congestion, sore throat, neck pain, tinnitus and ear discharge.   Eyes: Negative for blurred vision, double vision, photophobia, pain, discharge and redness.  Respiratory: Negative for cough, hemoptysis, sputum production, shortness of breath, wheezing and stridor.   Cardiovascular: Negative for chest pain, palpitations, orthopnea, claudication and leg swelling.  Gastrointestinal: Negative for heartburn,  constipation, blood in stool and melena.  Genitourinary: PER HPI.  Musculoskeletal: Negative for myalgias, back pain, joint pain and falls.  Skin: Negative for itching and rash.  Neurological:Negative for tingling, tremors, sensory change, speech change, focal weakness, loss of consciousness and headaches.  Endo/Heme/Allergies: Negative for environmental allergies and polydipsia. Does not bruise/bleed easily.  Psychiatric/Behavioral: Negative for suicidal ideas. The patient is not nervous/anxious.      Past Medical History  Diagnosis Date  . Emphysema lung     Past Surgical History  Procedure Laterality Date  . Prostatectomy  2001    Dr Risa Grill  . Abdominal aortic aneurysm repair  2001  . Colectomy  2005    diverticular rupture  . Colon reastomosis  2005    Social History:  reports that he quit smoking about 14 years ago. He does not have any smokeless tobacco history on file. He reports that he drinks alcohol. He reports that he does not use illicit drugs.  No known family medical history NKDA  Prior to Admission medications   Medication Sig Start Date End Date Taking? Authorizing Provider  omeprazole (PRILOSEC OTC) 20 MG tablet Take 20 mg by mouth daily.   Yes Historical Provider, MD  simvastatin (ZOCOR) 20 MG tablet Take 20 mg by mouth daily.   Yes Historical Provider, MD    Physical Exam: Filed Vitals:   06/11/13 0046 06/11/13 0145 06/11/13 0245 06/11/13 0252  BP:    130/78  Pulse:  94 81 83  Temp: 103.3 F (39.6 C)   98.6 F (37 C)  TempSrc: Rectal   Oral  Resp:  26 16 23   SpO2:  94% 96% 96%    Physical Exam  Constitutional: Appears well-developed and well-nourished. No distress.  HENT: Normocephalic. External right and left ear normal. Oropharynx is clear and moist.  Eyes: Conjunctivae and EOM are normal. PERRLA, no scleral icterus.  Neck: Normal ROM. Neck supple. No JVD. No tracheal deviation. No thyromegaly.  CVS: RRR, S1/S2 +, no murmurs, no gallops, no  carotid bruit.  Pulmonary: Effort and breath sounds normal, no stridor, rhonchi, wheezes, rales.  Abdominal: Soft. BS +,  no distension, tenderness in lower abd quadrants, no rebound or guarding.  Musculoskeletal: Normal range of motion. No edema and no tenderness.  Lymphadenopathy: No lymphadenopathy noted, cervical, inguinal. Neuro: Alert. Normal reflexes, muscle tone coordination. No cranial nerve deficit. Skin: Skin is warm and dry. No rash noted. Not diaphoretic. No erythema. No pallor.  Psychiatric: Normal mood and affect. Behavior, judgment, thought content normal.   Labs on Admission:  Basic Metabolic Panel:  Recent Labs Lab 06/11/13 0020  NA 141  K 4.6  CL 100  CO2 23  GLUCOSE 157*  BUN 43*  CREATININE 2.83*  CALCIUM 10.3   Liver Function Tests:  Recent Labs Lab 06/11/13 0020  AST 41*  ALT 16  ALKPHOS 110  BILITOT 1.4*  PROT 7.5  ALBUMIN 3.7    Recent Labs Lab 06/11/13 0020  LIPASE 16   No results found for this basename: AMMONIA,  in the last 168 hours CBC:  Recent Labs Lab 06/11/13 0020  WBC 10.8*  NEUTROABS 9.8*  HGB 13.4  HCT 39.6  MCV 99.0  PLT 78*   Cardiac Enzymes:  Recent Labs Lab 06/11/13 0020  TROPONINI <0.30   BNP: No components found with this basename: POCBNP,  CBG: No results found for this basename: GLUCAP,  in the last 168 hours  Radiological Exams on Admission: Dg Chest 2 View  06/11/2013   CLINICAL DATA:  80 year old male with hypoxia.  EXAM: CHEST  2 VIEW  COMPARISON:  11/13/2011  FINDINGS: Cardiomegaly noted.  A moderate hiatal hernia again identified.  There is no evidence of focal airspace disease, pulmonary edema, suspicious pulmonary nodule/mass, pleural effusion, or pneumothorax. No acute bony abnormalities are identified.  Mid thoracic compression fractures are unchanged.  IMPRESSION: Cardiomegaly without evidence of acute cardiopulmonary disease.  Moderate hiatal hernia.   Electronically Signed   By: Hassan Rowan  M.D.   On: 06/11/2013 01:21   Dg Abd 2 Views  06/11/2013   CLINICAL DATA:  Vomiting and abdominal pain.  EXAM: ABDOMEN - 2 VIEW  COMPARISON:  Abdominal radiograph performed 07/25/2010  FINDINGS: The visualized bowel gas pattern is unremarkable. Scattered air and stool filled loops of colon are seen; no abnormal dilatation of small bowel loops is seen to suggest small bowel obstruction. No free intra-abdominal air is identified on the provided decubitus view. Clips are noted within the right upper quadrant, reflecting prior cholecystectomy.  Mild left convex lumbar scoliosis is noted; the sacroiliac joints are unremarkable in appearance. The visualized lung bases are essentially clear.  IMPRESSION: Unremarkable bowel gas pattern; no free intra-abdominal air seen.   Electronically Signed   By: Garald Balding M.D.   On: 06/11/2013 01:39    EKG: Normal sinus rhythm, no ST/T wave changes  Faye Ramsay, MD  [facility] Pager [phone]  If 7PM-7AM, please contact night-coverage [email or web address] Password [facility] 06/11/2013, 3:34 AM

## 2013-06-11 NOTE — ED Notes (Signed)
Dr. Doyle Askew paged.

## 2013-06-11 NOTE — Progress Notes (Addendum)
Called results of cultures to NP on duty: blood cultures in the aerobic and anaerobic gram - rods, also noted in the urine cultures gram - rodsl. SRP, RN

## 2013-06-11 NOTE — ED Notes (Signed)
Patient had O2 off. Room air sats 89%. O2 3L/min ia [place] replaced and sats increased to 94%.

## 2013-06-12 LAB — CBC
HEMATOCRIT: 36.3 % — AB (ref 39.0–52.0)
HEMOGLOBIN: 12 g/dL — AB (ref 13.0–17.0)
MCH: 33.1 pg (ref 26.0–34.0)
MCHC: 33.1 g/dL (ref 30.0–36.0)
MCV: 100.3 fL — AB (ref 78.0–100.0)
Platelets: 62 10*3/uL — ABNORMAL LOW (ref 150–400)
RBC: 3.62 MIL/uL — AB (ref 4.22–5.81)
RDW: 13.8 % (ref 11.5–15.5)
WBC: 8.8 10*3/uL (ref 4.0–10.5)

## 2013-06-12 LAB — BASIC METABOLIC PANEL
BUN: 59 mg/dL — AB (ref 6–23)
CO2: 21 meq/L (ref 19–32)
CREATININE: 3.38 mg/dL — AB (ref 0.50–1.35)
Calcium: 8.8 mg/dL (ref 8.4–10.5)
Chloride: 100 mEq/L (ref 96–112)
GFR calc Af Amer: 18 mL/min — ABNORMAL LOW (ref >90)
GFR, EST NON AFRICAN AMERICAN: 15 mL/min — AB (ref >90)
Glucose, Bld: 117 mg/dL — ABNORMAL HIGH (ref 70–99)
Potassium: 4.5 mEq/L (ref 3.7–5.3)
SODIUM: 138 meq/L (ref 137–147)

## 2013-06-12 MED ORDER — SODIUM CHLORIDE 0.9 % IV BOLUS (SEPSIS)
500.0000 mL | Freq: Once | INTRAVENOUS | Status: AC
  Administered 2013-06-12: 500 mL via INTRAVENOUS

## 2013-06-12 MED ORDER — DEXTROSE 5 % IV SOLN
1.0000 g | INTRAVENOUS | Status: DC
  Administered 2013-06-13: 1 g via INTRAVENOUS
  Filled 2013-06-12: qty 1

## 2013-06-12 MED ORDER — DEXTROSE 5 % IV SOLN
2.0000 g | Freq: Once | INTRAVENOUS | Status: AC
  Administered 2013-06-12: 2 g via INTRAVENOUS
  Filled 2013-06-12: qty 2

## 2013-06-12 NOTE — Progress Notes (Signed)
PROGRESS NOTE    Derrick [name] QQP:[id number] DOB: 04/29/1928 DOA: 06/10/2013 PCP: Jerlyn Ly, MD Urology: [name]  HPI/Brief narrative 80 year old male with history of emphysema, prostate cancer status post prostatectomy, presented with complaints of nausea, vomiting, nonproductive cough, abdominal pain,? Urinary frequency and urgency and fever. He was admitted for likely UTI/pyelonephritis and possible pneumonia.  Assessment/Plan:  1. UTI left pyelonephritis: Continue antibiotics pending final urine culture results. 2. GN bacteremia - change coverage to Cefepime, awaiting speciation.  3. Possible community-acquired pneumonia: CT abdomen shows mild focal nodular opacity at the right lung base which may reflect a mild infectious process. Patient does have nonproductive cough which is new but no dyspnea. Continue empiric Zosyn. May need CT chest in a few weeks to ensure resolution that since it did not show up on chest x-ray. 4. Acute encephalopathy: Overnight, patient became confused and slightly agitated in the context of fevers. Likely secondary to febrile illness. Improved. 5. Acute on stage III chronic kidney disease: Baseline creatinine? 2.4, stage III, has been having this since 2009 at least. Worsening today likely secondary to dehydration. Hydration with IV fluids and follow BMP. Patient is not following with nephrology; renal function worse overnight, GFR 15 this morning. Nephrology consulted today, appreciate input.  6. Acute on chronic thrombocytopenia: No bleeding. Likely precipitated by acute infection. Follow CBCs. Mild leukopenia also secondary to same. 7. History of emphysema: Stable 8. History of prostate cancer: Outpatient followup with urology  Code Status: Full Family Communication: Discussed with spouse at length at bedside. Disposition Plan: Home when medically stable  Consultants:  None  Procedures:  None  Antibiotics:  IV Zosyn 2/25 >  2/26  Cefepime 2/26>>  Subjective: - Patient feeling much better this morning  Objective: Filed Vitals:   06/11/13 1332 06/11/13 1413 06/11/13 2155 06/12/13 0545  BP:  92/71 142/65 125/73  Pulse:  78 84 77  Temp: 98.2 F (36.8 C) 97.9 F (36.6 C) 97.7 F (36.5 C) 97.2 F (36.2 C)  TempSrc: Oral Oral Oral Oral  Resp:  22 24 20   Height:  6\' 2"  (1.88 m)    Weight:  72.4 kg (159 lb 9.8 oz)    SpO2:  95% 96% 98%    Intake/Output Summary (Last 24 hours) at 06/12/13 0759 Last data filed at 06/12/13 0600  Gross per 24 hour  Intake   1350 ml  Output      0 ml  Net   1350 ml   Filed Weights   06/11/13 1413  Weight: 72.4 kg (159 lb 9.8 oz)     Exam:  General exam: Elderly pleasant male lying comfortably in bed Respiratory system: Slightly reduced breath sounds in the bases but otherwise clear to auscultation. No increased work of breathing. Cardiovascular system: S1 & S2 heard, RRR. No JVD, murmurs, gallops, clicks or pedal edema. Gastrointestinal system: Abdomen is nondistended, soft and nontender. Normal bowel sounds heard. Central nervous system: Alert and oriented x2. No focal neurological deficits. Extremities: Symmetric 5 x 5 power.   Data Reviewed: Basic Metabolic Panel:  Recent Labs Lab 06/11/13 0020 06/11/13 0630 06/12/13 0405  NA 141 136* 138  K 4.6 4.5 4.5  CL 100 97 100  CO2 23 20 21   GLUCOSE 157* 150* 117*  BUN 43* 45* 59*  CREATININE 2.83* 2.82* 3.38*  CALCIUM 10.3 9.5 8.8  MG  --  1.9  --   PHOS  --  5.0*  --    Liver Function Tests:  Recent Labs Lab 06/11/13 0020  AST 41*  ALT 16  ALKPHOS 110  BILITOT 1.4*  PROT 7.5  ALBUMIN 3.7    Recent Labs Lab 06/11/13 0020  LIPASE 16   No results found for this basename: AMMONIA,  in the last 168 hours CBC:  Recent Labs Lab 06/11/13 0020 06/11/13 0630 06/12/13 0405  WBC 10.8* 3.3* 8.8  NEUTROABS 9.8*  --   --   HGB 13.4 13.3 12.0*  HCT 39.6 39.3 36.3*  MCV 99.0 99.2 100.3*  PLT  78* 60* 62*   Cardiac Enzymes:  Recent Labs Lab 06/11/13 0020  TROPONINI <0.30   BNP (last 3 results) No results found for this basename: PROBNP,  in the last 8760 hours CBG: No results found for this basename: GLUCAP,  in the last 168 hours  Recent Results (from the past 240 hour(s))  CULTURE, BLOOD (ROUTINE X 2)     Status: None   Collection Time    06/11/13 12:35 AM      Result Value Ref Range Status   Specimen Description BLOOD RIGHT ANTECUBITAL   Final   Special Requests BOTTLES DRAWN AEROBIC AND ANAEROBIC 5CC   Final   Culture  Setup Time     Final   Value: 06/11/2013 03:22     Performed at [facility]   Culture     Final   Value: GRAM NEGATIVE RODS     Note: Gram Stain Report Called to,Read Back By and Verified With: SOPHIA PICKETT ON 06/11/2013 AT 8:40P BY WILEJ     Performed at [facility]   Report Status PENDING   Incomplete  CULTURE, BLOOD (ROUTINE X 2)     Status: None   Collection Time    06/11/13 12:37 AM      Result Value Ref Range Status   Specimen Description BLOOD LEFT ANTECUBITAL   Final   Special Requests BOTTLES DRAWN AEROBIC AND ANAEROBIC 5CC   Final   Culture  Setup Time     Final   Value: 06/11/2013 03:22     Performed at [facility]   Culture     Final   Value: GRAM NEGATIVE RODS     Note: Gram Stain Report Called to,Read Back By and Verified With: SOPHIA PICKETT ON 06/11/2013 AT 8:40P BY WILEJ     Performed at [facility]   Report Status PENDING   Incomplete      Studies: Ct Abdomen Pelvis Wo Contrast  06/11/2013   CLINICAL DATA:  Fever, nausea and vomiting.  Leukocytosis.  EXAM: CT ABDOMEN AND PELVIS WITHOUT CONTRAST  TECHNIQUE: Multidetector CT imaging of the abdomen and pelvis was performed following the standard protocol without intravenous contrast.  COMPARISON:  CT of the abdomen and pelvis performed 07/25/2010  FINDINGS: Mild focal nodular opacity at the right lung base may reflect a mild infectious  process; an underlying suspicious nodule cannot be excluded, but is not well characterized due to motion artifact.  A small-to-moderate hiatal hernia is seen.  The liver and spleen are unremarkable in appearance. The gallbladder is within normal limits. The pancreas and adrenal glands are unremarkable.  Numerous large bilateral renal cysts are seen. Nonspecific perinephric stranding is noted on the left side. This raises concern for left-sided pyelonephritis. Mild bilateral renal atrophy is noted. There is no evidence of hydronephrosis. No renal or ureteral stones are seen.  No free fluid is identified. The small bowel is unremarkable in appearance. The stomach is  within normal limits. No acute vascular abnormalities are seen. Scattered calcification is seen along the abdominal aorta and its branches. The patient is status post aortoiliac stent graft. Prominent bilateral common femoral artery aneurysms are seen, measuring 4.1 cm on the right and 3.4 cm on the left.  The appendix is not definitely seen; there is no evidence for appendicitis. The colon is unremarkable in appearance.  The bladder is mildly distended and grossly unremarkable in appearance. The patient is status post prostatectomy. No inguinal lymphadenopathy is seen.  No acute osseous abnormalities are identified. Degenerative change is noted along the lumbar spine, with multilevel vacuum phenomenon.  IMPRESSION: 1. Nonspecific perinephric stranding noted about the left kidney, new from the prior study. This could reflect mild left-sided pyelonephritis. Would correlate clinically for associated symptoms. 2. Numerous large bilateral renal cysts again seen, with mild bilateral renal atrophy. 3. Mild focal nodular opacity at the right lung base may reflect a mild infectious process; an underlying suspicious nodule cannot be excluded, but is not well characterized due to motion artifact. CT of the chest could be considered for further evaluation, on an  elective nonemergent basis. 4. Small to moderate hiatal hernia seen. 5. Per prominent bilateral common femoral arteries noted, measuring 4.1 cm on the right and 3.4 cm on the left. These have increased in size from 2012.   Electronically Signed   By: Garald Balding M.D.   On: 06/11/2013 04:17   Dg Chest 2 View  06/11/2013   CLINICAL DATA:  80 year old male with hypoxia.  EXAM: CHEST  2 VIEW  COMPARISON:  11/13/2011  FINDINGS: Cardiomegaly noted.  A moderate hiatal hernia again identified.  There is no evidence of focal airspace disease, pulmonary edema, suspicious pulmonary nodule/mass, pleural effusion, or pneumothorax. No acute bony abnormalities are identified.  Mid thoracic compression fractures are unchanged.  IMPRESSION: Cardiomegaly without evidence of acute cardiopulmonary disease.  Moderate hiatal hernia.   Electronically Signed   By: Hassan Rowan M.D.   On: 06/11/2013 01:21   Dg Abd 2 Views  06/11/2013   CLINICAL DATA:  Vomiting and abdominal pain.  EXAM: ABDOMEN - 2 VIEW  COMPARISON:  Abdominal radiograph performed 07/25/2010  FINDINGS: The visualized bowel gas pattern is unremarkable. Scattered air and stool filled loops of colon are seen; no abnormal dilatation of small bowel loops is seen to suggest small bowel obstruction. No free intra-abdominal air is identified on the provided decubitus view. Clips are noted within the right upper quadrant, reflecting prior cholecystectomy.  Mild left convex lumbar scoliosis is noted; the sacroiliac joints are unremarkable in appearance. The visualized lung bases are essentially clear.  IMPRESSION: Unremarkable bowel gas pattern; no free intra-abdominal air seen.   Electronically Signed   By: Garald Balding M.D.   On: 06/11/2013 01:39        Scheduled Meds: . [START ON 06/13/2013] ceFEPime (MAXIPIME) IV  1 g Intravenous Q24H  . ceFEPime (MAXIPIME) IV  2 g Intravenous Once  . pantoprazole  40 mg Oral Daily  . simvastatin  20 mg Oral Daily  . sodium  chloride  500 mL Intravenous Once  . sodium chloride  3 mL Intravenous Q12H   Continuous Infusions:    Active Problems:   Fever   Pyelonephritis, acute   Community acquired pneumonia   Acute encephalopathy   Renal failure, acute on chronic   Thrombocytopenia  Time spent: 35 minutes  Marzetta Board, MD [facility] Pager [phone]  If 7PM-7AM, please contact night-coverage [email or web address]  Password [email or web address] 06/12/2013, 7:59 AM    LOS: 2 days

## 2013-06-12 NOTE — Progress Notes (Signed)
Utilization review completed.  

## 2013-06-12 NOTE — Progress Notes (Signed)
NP on duty notified of crtitical lab results--ordered noted and completed. SRP, RN

## 2013-06-12 NOTE — Progress Notes (Signed)
Physical Therapy Treatment Patient Details Name: Derrick [name] MRN: [id number] DOB: 11/04/1928 Today's Date: 06/12/2013 Time: 1110-1140 PT Time Calculation (min): 30 min  PT Assessment / Plan / Recommendation  History of Present Illness 80 yo admitted with fever, nausea. Hx of emphysema, prostate cancer, colon rescetion.    PT Comments   Pt in bed on 3 lts avg 97%.  Amb with RW pt was very clumbsy and not use to it so amb w/o but pt fatigued   Follow Up Recommendations  Home health PT     Does the patient have the potential to tolerate intense rehabilitation     Barriers to Discharge        Equipment Recommendations       Recommendations for Other Services    Frequency Min 3X/week   Progress towards PT Goals Progress towards PT goals: Progressing toward goals  Plan      Precautions / Restrictions Precautions Precautions: Fall Restrictions Weight Bearing Restrictions: No    Pertinent Vitals/Pain amb on RA sats avg 94%    Mobility  Bed Mobility Overal bed mobility: Modified Independent General bed mobility comments: increased time Transfers Overall transfer level: Needs assistance Equipment used: None Transfers: Sit to/from Stand Sit to Stand: Supervision;Min guard General transfer comment: increased time and one VC on turn completion prior to sit Ambulation/Gait Ambulation/Gait assistance: Supervision;Min guard Ambulation Distance (Feet): 350 Feet Assistive device: None Gait Pattern/deviations: Step-through pattern Gait velocity: WFL General Gait Details: Pt was more clumbsy with the walker so amb w/o.      Exercises     PT Diagnosis:    PT Problem List:   PT Treatment Interventions:     PT Goals (current goals can now be found in the care plan section)    Visit Information  Last PT Received On: 06/12/13 Assistance Needed: +1 History of Present Illness: 80 yo admitted with fever, nausea. Hx of emphysema, prostate cancer, colon rescetion.      Subjective Data      Cognition       Balance     End of Session PT - End of Session Equipment Utilized During Treatment: Gait belt Activity Tolerance: Patient limited by fatigue Patient left: in chair;with call bell/phone within reach   Rica Koyanagi  PTA [facility]  Acute  Rehab Pager      [phone]

## 2013-06-12 NOTE — Progress Notes (Signed)
Chaplain provided spiritual support at bedside with pt and spouse.   Pt hopeful for recovery, but indicated several times about uncertainty of health due to advanced age -- making statements such as "if I get better" and "I hope I'm here to see my grandson graduate."   Engaged pt around feelings of fear / uncertainty, provided support around changes in life, expectations.   Will continue to follow for support during admission.   [place], [place]

## 2013-06-12 NOTE — Progress Notes (Signed)
ANTIBIOTIC CONSULT NOTE - INITIAL  Pharmacy Consult for cefepime Indication: bacteremia  No Known Allergies  Patient Measurements: Height: 6\' 2"  (188 cm) (Simultaneous filing. User may not have seen previous data.) Weight: 159 lb 9.8 oz (72.4 kg) (Simultaneous filing. User may not have seen previous data.) IBW/kg (Calculated) : 82.2  Vital Signs: Temp: 97.2 F (36.2 C) (02/26 0545) Temp src: Oral (02/26 0545) BP: 125/73 mmHg (02/26 0545) Pulse Rate: 77 (02/26 0545) Intake/Output from previous day: 02/25 0701 - 02/26 0700 In: 1350 [I.V.:1200; IV Piggyback:150] Out: -  Intake/Output from this shift:    Labs:  Recent Labs  06/11/13 0020 06/11/13 0630 06/12/13 0405  WBC 10.8* 3.3* 8.8  HGB 13.4 13.3 12.0*  PLT 78* 60* 62*  CREATININE 2.83* 2.82* 3.38*   Estimated Creatinine Clearance: 17 ml/min (by C-G formula based on Cr of 3.38).    Microbiology: Recent Results (from the past 720 hour(s))  CULTURE, BLOOD (ROUTINE X 2)     Status: None   Collection Time    06/11/13 12:35 AM      Result Value Ref Range Status   Specimen Description BLOOD RIGHT ANTECUBITAL   Final   Special Requests BOTTLES DRAWN AEROBIC AND ANAEROBIC 5CC   Final   Culture  Setup Time     Final   Value: 06/11/2013 03:22     Performed at [facility]   Culture     Final   Value: GRAM NEGATIVE RODS     Note: Gram Stain Report Called to,Read Back By and Verified With: SOPHIA PICKETT ON 06/11/2013 AT 8:40P BY WILEJ     Performed at [facility]   Report Status PENDING   Incomplete  CULTURE, BLOOD (ROUTINE X 2)     Status: None   Collection Time    06/11/13 12:37 AM      Result Value Ref Range Status   Specimen Description BLOOD LEFT ANTECUBITAL   Final   Special Requests BOTTLES DRAWN AEROBIC AND ANAEROBIC 5CC   Final   Culture  Setup Time     Final   Value: 06/11/2013 03:22     Performed at [facility]   Culture     Final   Value: GRAM NEGATIVE RODS     Note: Gram  Stain Report Called to,Read Back By and Verified With: SOPHIA PICKETT ON 06/11/2013 AT 8:40P BY WILEJ     Performed at [facility]   Report Status PENDING   Incomplete    Medical History: Past Medical History  Diagnosis Date  . Emphysema lung     Medications:  Scheduled:  . pantoprazole  40 mg Oral Daily  . simvastatin  20 mg Oral Daily  . sodium chloride  3 mL Intravenous Q12H   Infusions:   PRN: HYDROcodone-acetaminophen, HYDROmorphone (DILAUDID) injection, ondansetron (ZOFRAN) IV, ondansetron  Assessment: 80 y/o M admitted with UTI / possible L pyelonephritis, possible CAP, acute encephalopathy, and acute on chronic kidney disease.  Was initially started on empiric Zosyn but SCr continues to increase, and blood culture now growing GNR (ID and susceptibilities pending).  Orders received to switch empiric antibiotic from Zosyn to cefepime.  Goal of Therapy:  Appropriate antibiotic dosing; eradication of infection  Plan:  Cefepime 2 grams IV x 1, then 1 gram IV q24h Await final report from blood culture Follow serum creatinine and clinical course.  Clayburn Pert, PharmD, BCPS Pager: [phone] 06/12/2013  7:54 AM ]

## 2013-06-12 NOTE — Consult Note (Signed)
Renal Service Consult Note [name]  Derrick [name] 06/12/2013 Derrick [name] D Requesting Physician:  Dr Cruzita Lederer  Reason for Consult:  Elevated creat in 80 yo male HPI: The patient is a 80 y.o. year-old admitted with gen weakness, unable to get out of bed.  Also lower abd discomfort and fevers. In ED temp was 103 G and pt admitted for possible UTI, started on Levaquin.    Patient has hx of prostate cancer with prostatectomy in 2001.  He denies voiding difficulty, nsaid use or gross hematuria.  He denies recent probs with sig nausea, vomiting or diarrhea.  No sob or CP.  No known hx of renal disease.  Creat here is 2.82 on admission and 3.38 today.  eGFR is 15-19 ml/min.    PMH: COPD, PVD.  AAA repair 2002, hernia repair 2004, cholecystectomy/sigmoid colectomy (diverticulitis) 2005, prostatectomy 2001, laparotomy/SBO 1990, [name] repair 2004 May 2012 >  ROS  no ha  no cp, sob  no abd pain  no n/v  denies diarreha  Past Medical History  Past Medical History  Diagnosis Date  . Emphysema lung    Past Surgical History  Past Surgical History  Procedure Laterality Date  . Prostatectomy  2001    Dr Risa Grill  . Abdominal aortic aneurysm repair  2001  . Colectomy  2005    diverticular rupture  . Colon reastomosis  2005   Family History History reviewed. No pertinent family history. Social History  reports that he quit smoking about 14 years ago. He has never used smokeless tobacco. He reports that he drinks alcohol. He reports that he does not use illicit drugs. Allergies No Known Allergies Home medications Prior to Admission medications   Medication Sig Start Date End Date Taking? Authorizing Provider  omeprazole (PRILOSEC OTC) 20 MG tablet Take 20 mg by mouth daily.   Yes Historical Provider, MD  simvastatin (ZOCOR) 20 MG tablet Take 20 mg by mouth daily.   Yes Historical Provider, MD   Liver Function Tests  Recent Labs Lab 06/11/13 0020  AST 41*  ALT 16   ALKPHOS 110  BILITOT 1.4*  PROT 7.5  ALBUMIN 3.7    Recent Labs Lab 06/11/13 0020  LIPASE 16   CBC  Recent Labs Lab 06/11/13 0020 06/11/13 0630 06/12/13 0405  WBC 10.8* 3.3* 8.8  NEUTROABS 9.8*  --   --   HGB 13.4 13.3 12.0*  HCT 39.6 39.3 36.3*  MCV 99.0 99.2 100.3*  PLT 78* 60* 62*   Basic Metabolic Panel  Recent Labs Lab 06/11/13 0020 06/11/13 0630 06/12/13 0405  NA 141 136* 138  K 4.6 4.5 4.5  CL 100 97 100  CO2 23 20 21   GLUCOSE 157* 150* 117*  BUN 43* 45* 59*  CREATININE 2.83* 2.82* 3.38*  CALCIUM 10.3 9.5 8.8  PHOS  --  5.0*  --     Filed Vitals:   06/11/13 1413 06/11/13 2155 06/12/13 0545 06/12/13 1424  BP: 92/71 142/65 125/73 104/67  Pulse: 78 84 77 76  Temp: 97.9 F (36.6 C) 97.7 F (36.5 C) 97.2 F (36.2 C) 97.5 F (36.4 C)  TempSrc: Oral Oral Oral Oral  Resp: 22 24 20 18   Height: 6' 2"  (1.88 m)     Weight: 72.4 kg (159 lb 9.8 oz)     SpO2: 95% 96% 98% 98%   Exam Frail elderly male, no distress No rash, cyanosis or gangrene Sclera anicteric, throat dry Flat neck veins Chest mostly clear, occ  basilar rales RRR no MRG, distant HS Abd soft, nt, nd, no ascites GU normal Ext no LE or UE edema Neuro no asterixis, nf, ox3  UA > neg protein, 0-1wbc, no rbc's,  many bact CT abd > numerous bilat large renal cysts are seen.  Bilat renal atrophy is noted.  No hydronephrosis  Assessment: 1 Chronic kidney disease, stage IV- UA is benign, CT shows very large bilateral renal cysts compromising renal architecture.  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 This disease doesn't usually present until late in life.  There is nothing to be done, CKD will progress but not clear how fast. He is euvolemic.  HTN in these pts may benefit from ACEI, but not needed now. The disease is progressive and there is no treatment, this was d/w pt and his wife. I don't see a benefit working him up for other causes of CKD as he will not be a candidate for  immunosuoppression given advanced age and advanced renal failure stage.   Pt said from the start the he will not do dialysis, he is "ready to go when his time comes".  I offered renal OP f/u but they want to just follow with their PCP and urologist for now.  He understands this will progress and questions were answered.      He also requests no heroic measures in case or cardiac arrest, have written DNR order.  Thanks for the referral, will sign off. Please call as needed.    Kelly Splinter MD (pgr) [phone]    [phone] 06/12/2013, 4:05 PM

## 2013-06-13 DIAGNOSIS — N179 Acute kidney failure, unspecified: Secondary | ICD-10-CM

## 2013-06-13 DIAGNOSIS — I4891 Unspecified atrial fibrillation: Secondary | ICD-10-CM

## 2013-06-13 LAB — BASIC METABOLIC PANEL
BUN: 63 mg/dL — ABNORMAL HIGH (ref 6–23)
CALCIUM: 8.7 mg/dL (ref 8.4–10.5)
CO2: 22 meq/L (ref 19–32)
CREATININE: 3.19 mg/dL — AB (ref 0.50–1.35)
Chloride: 106 mEq/L (ref 96–112)
GFR calc Af Amer: 19 mL/min — ABNORMAL LOW (ref >90)
GFR, EST NON AFRICAN AMERICAN: 17 mL/min — AB (ref >90)
Glucose, Bld: 120 mg/dL — ABNORMAL HIGH (ref 70–99)
Potassium: 4.1 mEq/L (ref 3.7–5.3)
SODIUM: 142 meq/L (ref 137–147)

## 2013-06-13 LAB — CBC
HCT: 34.9 % — ABNORMAL LOW (ref 39.0–52.0)
Hemoglobin: 11.5 g/dL — ABNORMAL LOW (ref 13.0–17.0)
MCH: 33.2 pg (ref 26.0–34.0)
MCHC: 33 g/dL (ref 30.0–36.0)
MCV: 100.9 fL — AB (ref 78.0–100.0)
PLATELETS: 65 10*3/uL — AB (ref 150–400)
RBC: 3.46 MIL/uL — AB (ref 4.22–5.81)
RDW: 13.8 % (ref 11.5–15.5)
WBC: 6.5 10*3/uL (ref 4.0–10.5)

## 2013-06-13 LAB — CULTURE, BLOOD (ROUTINE X 2)

## 2013-06-13 LAB — MAGNESIUM: MAGNESIUM: 2.1 mg/dL (ref 1.5–2.5)

## 2013-06-13 LAB — URINE CULTURE: Colony Count: >=100000

## 2013-06-13 LAB — PHOSPHORUS: PHOSPHORUS: 4.3 mg/dL (ref 2.3–4.6)

## 2013-06-13 LAB — APTT: aPTT: 105 seconds — ABNORMAL HIGH (ref 24–37)

## 2013-06-13 LAB — PROTIME-INR
INR: 1.1 (ref 0.00–1.49)
Prothrombin Time: 14 seconds (ref 11.6–15.2)

## 2013-06-13 MED ORDER — HEPARIN BOLUS VIA INFUSION
3500.0000 [IU] | Freq: Once | INTRAVENOUS | Status: AC
  Administered 2013-06-13: 3500 [IU] via INTRAVENOUS
  Filled 2013-06-13: qty 3500

## 2013-06-13 MED ORDER — CIPROFLOXACIN IN D5W 400 MG/200ML IV SOLN
400.0000 mg | Freq: Two times a day (BID) | INTRAVENOUS | Status: DC
  Filled 2013-06-13: qty 200

## 2013-06-13 MED ORDER — HEPARIN (PORCINE) IN NACL 100-0.45 UNIT/ML-% IJ SOLN
1000.0000 [IU]/h | INTRAMUSCULAR | Status: DC
  Administered 2013-06-13: 1000 [IU]/h via INTRAVENOUS
  Filled 2013-06-13: qty 250

## 2013-06-13 MED ORDER — CIPROFLOXACIN IN D5W 400 MG/200ML IV SOLN
400.0000 mg | INTRAVENOUS | Status: DC
  Administered 2013-06-13 – 2013-06-15 (×3): 400 mg via INTRAVENOUS
  Filled 2013-06-13 (×3): qty 200

## 2013-06-13 NOTE — Consult Note (Signed)
Primary Physician: Primary Cardiologist:   HPI: Derrick [name] is an 80 yo with history of COPD, prostate CA, PVOD (s/p AAA repair) Colon cancer,  Admitted on 2/25 with fever, abdominal pain.  Started treatment for urine infeciton.  Was in SR on admit  Sats low on RA  O2 started  CT showed opacity in R lung.  Blood cults positive with G= rods as was urine  Today HR noted to be irrectular.  EKG with atrial fibrillation.   Patinet denies palpitations  Breathing has not changed much  Not short at rest  Tired  Didn't sleep well        Past Medical History  Diagnosis Date  . Emphysema lung     Medications Prior to Admission  Medication Sig Dispense Refill  . omeprazole (PRILOSEC OTC) 20 MG tablet Take 20 mg by mouth daily.      . simvastatin (ZOCOR) 20 MG tablet Take 20 mg by mouth daily.         . ciprofloxacin  400 mg Intravenous Q24H  . pantoprazole  40 mg Oral Daily  . simvastatin  20 mg Oral Daily  . sodium chloride  3 mL Intravenous Q12H    Infusions:    No Known Allergies  History   Social History  . Marital Status: Married    Spouse Name: N/A    Number of Children: N/A  . Years of Education: N/A   Occupational History  . Not on file.   Social History Main Topics  . Smoking status: Former Smoker    Quit date: 04/18/1999  . Smokeless tobacco: Never Used  . Alcohol Use: Yes     Comment: Beer-seldom   . Drug Use: No  . Sexual Activity: No   Other Topics Concern  . Not on file   Social History Narrative  . No narrative on file    History reviewed. No pertinent family history.  REVIEW OF SYSTEMS:  All systems reviewed  Negative to the above problem except as noted above.    PHYSICAL EXAM: Filed Vitals:   06/13/13 1306  BP: 125/74  Pulse: 94  Temp: 97.2 F (36.2 C)  Resp: 18     Intake/Output Summary (Last 24 hours) at 06/13/13 1333 Last data filed at 06/13/13 1300  Gross per 24 hour  Intake    840 ml  Output   1050 ml  Net   -210 ml     General:  Well appearing. No respiratory difficulty HEENT: normal Neck: supple. no JVD. Carotids 2+ bilat;Bruit L neck  No lymphadenopathy or thryomegaly appreciated. Cor: PMI nondisplaced. Irregular rate & rhythm. No rubs, gallops or murmurs. Lungs: Mild wheezes bilaterally  Moving air.   Abdomen: soft, nontender, nondistended. No hepatosplenomegaly. No bruits or masses. Good bowel sounds. Extremities: no cyanosis, clubbing, rash, edema Neuro: alert & oriented x 3, cranial nerves grossly intact. moves all 4 extremities w/o difficulty. Affect pleasant.  ECG:  Results for orders placed during the hospital encounter of 06/10/13 (from the past 24 hour(s))  CBC     Status: Abnormal   Collection Time    06/13/13  4:12 AM      Result Value Ref Range   WBC 6.5  4.0 - 10.5 K/uL   RBC 3.46 (*) 4.22 - 5.81 MIL/uL   Hemoglobin 11.5 (*) 13.0 - 17.0 g/dL   HCT 34.9 (*) 39.0 - 52.0 %   MCV 100.9 (*) 78.0 - 100.0 fL   MCH 33.2  26.0 -  34.0 pg   MCHC 33.0  30.0 - 36.0 g/dL   RDW 13.8  11.5 - 15.5 %   Platelets 65 (*) 150 - 400 K/uL  BASIC METABOLIC PANEL     Status: Abnormal   Collection Time    06/13/13  4:12 AM      Result Value Ref Range   Sodium 142  137 - 147 mEq/L   Potassium 4.1  3.7 - 5.3 mEq/L   Chloride 106  96 - 112 mEq/L   CO2 22  19 - 32 mEq/L   Glucose, Bld 120 (*) 70 - 99 mg/dL   BUN 63 (*) 6 - 23 mg/dL   Creatinine, Ser 3.19 (*) 0.50 - 1.35 mg/dL   Calcium 8.7  8.4 - 10.5 mg/dL   GFR calc non Af Amer 17 (*) >90 mL/min   GFR calc Af Amer 19 (*) >90 mL/min  PHOSPHORUS     Status: None   Collection Time    06/13/13  4:12 AM      Result Value Ref Range   Phosphorus 4.3  2.3 - 4.6 mg/dL  MAGNESIUM     Status: None   Collection Time    06/13/13  4:12 AM      Result Value Ref Range   Magnesium 2.1  1.5 - 2.5 mg/dL   No results found.  Impression:   Atrial fibrillation. May be related to increased sympatthetic drive with infection  May be coincidental. Rates are  fairly controlled  Patient denies SOB BP is adequate. I would follow HR  May need to start CA or b blocker if goes high. I would recomm starting anticoagulaton while here and continue to monitor.  If resolves has his clinical state improves can d/c.  If continues the would place on oral coumadin Would get echo to evaluate LV/LA  2.  L carotid bruit  Continue statin.  3.  CKD III

## 2013-06-13 NOTE — Progress Notes (Signed)
Notified by tele that pt is no longer in a fib. Now in SR. Jonette Eva notified. No new orders at this time. Brockton Mckesson, Bing Neighbors, RN

## 2013-06-13 NOTE — Progress Notes (Signed)
Notified from CMT that pt was in sinus arrhythmia.  Obtained 12 lead EKG (first one showed A.fib with RVR, second one a few seconds later showed ST with 2nd degree Heart block).  Lenny, CMT from [facility] called back saying pt was in between A.fib, A.flutter and multifocal atrial tachycardia.  Pt had also been in SR with PVC's earlier in the night.  This RN and another RN on the floor verified and looked at pt's history on monitor.  Pt had been in and out of A.fib/irregular, SR with PVC's/PAC's since before shift change last p.m.  VSS BP 90's over 60's and pt denying any chest pain or dizziness, just c/o restlessness.  Notified Tylene Fantasia, NP on call and no new orders-only to notify if pt's condition changes.  Will continue to monitor pt.

## 2013-06-13 NOTE — Progress Notes (Signed)
ANTICOAGULATION CONSULT NOTE - Initial Consult  Pharmacy Consult for Heparin Indication: atrial fibrillation  No Known Allergies  Patient Measurements: Height: 6\' 2"  (188 cm) (Simultaneous filing. User may not have seen previous data.) Weight: 159 lb 9.8 oz (72.4 kg) (Simultaneous filing. User may not have seen previous data.) IBW/kg (Calculated) : 82.2  Vital Signs: Temp: 97.2 F (36.2 C) (02/27 1306) Temp src: Oral (02/27 1306) BP: 125/74 mmHg (02/27 1306) Pulse Rate: 94 (02/27 1306)  Labs:  Recent Labs  06/11/13 0020 06/11/13 0630 06/12/13 0405 06/13/13 0412  HGB 13.4 13.3 12.0* 11.5*  HCT 39.6 39.3 36.3* 34.9*  PLT 78* 60* 62* 65*  CREATININE 2.83* 2.82* 3.38* 3.19*  TROPONINI <0.30  --   --   --     Estimated Creatinine Clearance: 18 ml/min (by C-G formula based on Cr of 3.19).   Medical History: Past Medical History  Diagnosis Date  . Emphysema lung     Medications:  Scheduled:  . ciprofloxacin  400 mg Intravenous Q24H  . pantoprazole  40 mg Oral Daily  . simvastatin  20 mg Oral Daily  . sodium chloride  3 mL Intravenous Q12H   Infusions:    Assessment: 89 yoM admitted 2/25 with fever, acute on chronic CKD, acute on chronic thrombocytopenia.  He was started on broad spectrum IV antibiotics for UTI, GN bacteremia, possible CAP.  PMH includes COPD, prostate CA, PVOD (s/p AAA repair), colon CA.  Pharmacy is consulted 2/27 to dose IV heparin for new onset Afib.  Baseline coags pending  CBC:  Hgb 11.5, Plt 65 (Plt 78 on admission.  Known acute on chronic thrombocytopenia.  Pt was given one dose of Lovenox for VTE prophylaxis on 2/25.)  SCr 3.19 with CrCl ~ 18 ml/min  Goal of Therapy:  Heparin level 0.3-0.7 units/ml Monitor platelets by anticoagulation protocol: Yes   Plan:   Baseline PTT, PT/INR  Give heparin 3500 units bolus IV x 1  Start heparin IV infusion at 1000 units/hr (10 ml/hr)  Heparin level 8 hours after starting  Daily heparin  level and CBC  Continue to monitor H&H and platelets   Gretta Arab PharmD, BCPS Pager [phone] 06/13/2013 4:28 PM

## 2013-06-13 NOTE — Progress Notes (Signed)
PROGRESS NOTE  Derrick [name] BWL:[id number] DOB: 1929-01-23 DOA: 06/10/2013 PCP: Jerlyn Ly, MD Urology: [name]  Assessment/Plan:  Patient and his wife with little trust in hospital team and is asking me this morning to run most aspects of his care by his PCP Dr. Joylene Draft. I have talked to Dr. Joylene Draft and he will stop by later tonight. I would be agreeable if he wishes to take over but I will leave that up to him.   1. UTI left pyelonephritis: Continue antibiotics, needs 14 day course. Repeat blood cultures in am to document clearance and consider transitioning to oral antibiotics.   2. GN bacteremia - speciated to E coli sensitive to Ciprofloxacin, narrow today. Continue IV for 24 more hours then transition to PO as he is clinically improving. He will need a 14 day course of antibiotics for complicated UTI. 3. A fib - this is new, tachycardic to 140s-160s overnight, seems like A fib w RVR vs flutter vs MAT. Cardiology consulted, appreciate input.  4. Possible community-acquired pneumonia: CT abdomen shows mild focal nodular opacity at the right lung base which may reflect a mild infectious process, doubt true pneumonia. Patient does have nonproductive cough which is new but no dyspnea. He is on room air.  5. Acute encephalopathy: Overnight after admission, patient became confused and slightly agitated in the context of fevers. Likely secondary to febrile illness. Improved. 6. Acute on stage III chronic kidney disease: Baseline creatinine? 2.4, stage III, has been having this since 2009 at least. Worsening 2/26 likely secondary to dehydration. Slowly improving on 2/27. Patient discussed with nephrology on 2/26 about his renal disease, they do not wish dialysis.  7. Acute on chronic thrombocytopenia: No bleeding. Likely precipitated by acute infection. Follow CBCs. Mild leukopenia also secondary to same. 8. History of emphysema: Stable 9. History of prostate cancer: Outpatient followup with  urology. I am trying to let [name] know he is here hospitalized.    Code Status: DNR after discussing with Dr. Jonnie Finner last night and with me today.  Family Communication: Discussed with spouse at length at bedside. Disposition Plan: Home when medically stable  Consultants:  None  Procedures:  None  Antibiotics:  IV Zosyn 2/25 > 2/26  Cefepime 2/26>>  Subjective: - Patient feeling better today.   Objective: Filed Vitals:   06/11/13 2155 06/12/13 0545 06/12/13 1424 06/12/13 2233  BP: 142/65 125/73 104/67 134/73  Pulse: 84 77 76 76  Temp: 97.7 F (36.5 C) 97.2 F (36.2 C) 97.5 F (36.4 C) 98 F (36.7 C)  TempSrc: Oral Oral Oral Oral  Resp: 24 20 18 18   Height:      Weight:      SpO2: 96% 98% 98% 98%    Intake/Output Summary (Last 24 hours) at 06/13/13 1050 Last data filed at 06/13/13 0900  Gross per 24 hour  Intake    600 ml  Output   1050 ml  Net   -450 ml   Filed Weights   06/11/13 1413  Weight: 72.4 kg (159 lb 9.8 oz)     Exam:  General exam: Elderly pleasant male lying comfortably in bed Respiratory system: Slightly reduced breath sounds in the bases but otherwise clear to auscultation. No increased work of breathing. Cardiovascular system: S1 & S2 heard, RRR. No JVD, murmurs, gallops, clicks or pedal edema. Gastrointestinal system: Abdomen is nondistended, soft and nontender. Normal bowel sounds heard. Central nervous system: Alert and oriented x2. No focal neurological deficits. Extremities: Symmetric  5 x 5 power.   Data Reviewed: Basic Metabolic Panel:  Recent Labs Lab 06/11/13 0020 06/11/13 0630 06/12/13 0405 06/13/13 0412  NA 141 136* 138 142  K 4.6 4.5 4.5 4.1  CL 100 97 100 106  CO2 23 20 21 22   GLUCOSE 157* 150* 117* 120*  BUN 43* 45* 59* 63*  CREATININE 2.83* 2.82* 3.38* 3.19*  CALCIUM 10.3 9.5 8.8 8.7  MG  --  1.9  --  2.1  PHOS  --  5.0*  --  4.3   Liver Function Tests:  Recent Labs Lab 06/11/13 0020  AST 41*    ALT 16  ALKPHOS 110  BILITOT 1.4*  PROT 7.5  ALBUMIN 3.7    Recent Labs Lab 06/11/13 0020  LIPASE 16   No results found for this basename: AMMONIA,  in the last 168 hours CBC:  Recent Labs Lab 06/11/13 0020 06/11/13 0630 06/12/13 0405 06/13/13 0412  WBC 10.8* 3.3* 8.8 6.5  NEUTROABS 9.8*  --   --   --   HGB 13.4 13.3 12.0* 11.5*  HCT 39.6 39.3 36.3* 34.9*  MCV 99.0 99.2 100.3* 100.9*  PLT 78* 60* 62* 65*   Cardiac Enzymes:  Recent Labs Lab 06/11/13 0020  TROPONINI <0.30   BNP (last 3 results) No results found for this basename: PROBNP,  in the last 8760 hours CBG: No results found for this basename: GLUCAP,  in the last 168 hours  Recent Results (from the past 240 hour(s))  CULTURE, BLOOD (ROUTINE X 2)     Status: None   Collection Time    06/11/13 12:35 AM      Result Value Ref Range Status   Specimen Description BLOOD RIGHT ANTECUBITAL   Final   Special Requests BOTTLES DRAWN AEROBIC AND ANAEROBIC 5CC   Final   Culture  Setup Time     Final   Value: 06/11/2013 03:22     Performed at [facility]   Culture     Final   Value: ESCHERICHIA COLI     Note: SUSCEPTIBILITIES PERFORMED ON PREVIOUS CULTURE WITHIN THE LAST 5 DAYS.     Note: Gram Stain Report Called to,Read Back By and Verified With: SOPHIA PICKETT ON 06/11/2013 AT 8:40P BY WILEJ     Performed at [facility]   Report Status 06/13/2013 FINAL   Final  CULTURE, BLOOD (ROUTINE X 2)     Status: None   Collection Time    06/11/13 12:37 AM      Result Value Ref Range Status   Specimen Description BLOOD LEFT ANTECUBITAL   Final   Special Requests BOTTLES DRAWN AEROBIC AND ANAEROBIC 5CC   Final   Culture  Setup Time     Final   Value: 06/11/2013 03:22     Performed at [facility]   Culture     Final   Value: ESCHERICHIA COLI     Note: Gram Stain Report Called to,Read Back By and Verified With: SOPHIA PICKETT ON 06/11/2013 AT 8:40P BY WILEJ     Performed at [facility]   Report Status 06/13/2013 FINAL   Final   Organism ID, Bacteria ESCHERICHIA COLI   Final  URINE CULTURE     Status: None   Collection Time    06/11/13  1:59 AM      Result Value Ref Range Status   Specimen Description URINE, CLEAN CATCH   Final   Special Requests NONE   Final  Culture  Setup Time     Final   Value: 06/11/2013 08:15     Performed at [facility]     Final   Value: >=100,000 COLONIES/ML     Performed at [facility]   Culture     Final   Value: ESCHERICHIA COLI     Performed at [facility]   Report Status 06/13/2013 FINAL   Final   Organism ID, Bacteria ESCHERICHIA COLI   Final      Studies: No results found.      Scheduled Meds: . ciprofloxacin  400 mg Intravenous Q12H  . pantoprazole  40 mg Oral Daily  . simvastatin  20 mg Oral Daily  . sodium chloride  3 mL Intravenous Q12H   Continuous Infusions:    Active Problems:   Fever   Pyelonephritis, acute   Community acquired pneumonia   Acute encephalopathy   Renal failure, acute on chronic   Thrombocytopenia  Time spent: 35 minutes  Marzetta Board, MD [facility] Pager [phone]  If 7PM-7AM, please contact night-coverage [email or web address] Password [email or web address] 06/13/2013, 10:50 AM    LOS: 3 days

## 2013-06-13 NOTE — Progress Notes (Signed)
Physical Therapy Treatment Patient Details Name: Danyel Griess Alleyne MRN: [id number] DOB: [birth year] Today's Date: 06/13/2013 Time: 7482-7078 PT Time Calculation (min): 28 min  PT Assessment / Plan / Recommendation  History of Present Illness 80 yo admitted with fever, nausea. Hx of emphysema, prostate cancer, colon rescetion.    PT Comments   Pt in bed on 3 lts O2 sats 98%.  RA at rest 96%.  RA during gait avg 90 - 91%. Amb pt around full unit + 1 hand hael asssit with 2 sitting rest breaks.  Pt performing at level to return to home with spouse.  Follow Up Recommendations  Home health PT     Does the patient have the potential to tolerate intense rehabilitation     Barriers to Discharge        Equipment Recommendations  Rolling walker with 5" wheels (for out of home use)    Recommendations for Other Services    Frequency     Progress towards PT Goals Progress towards PT goals: Progressing toward goals  Plan      Precautions / Restrictions Precautions Precautions: Fall Restrictions Weight Bearing Restrictions: No   Pertinent Vitals/Pain RA during gait avg 90 - 91%    Mobility  Bed Mobility Overal bed mobility: Modified Independent Bed Mobility: Supine to Sit;Sit to Supine General bed mobility comments: increased time Transfers Overall transfer level: Needs assistance Equipment used: None Transfers: Sit to/from Stand Sit to Stand: Supervision;Min guard General transfer comment: increased time and one VC on turn completion prior to sit as pt sat quickly due to fatigue Ambulation/Gait Ambulation/Gait assistance: Min guard;Supervision Ambulation Distance (Feet): 350 Feet Assistive device: None Gait Pattern/deviations: Step-through pattern Gait velocity: WFL General Gait Details: pt c/o increased fatigue due to poor sleep last night, "wottied about things".  Amb on RA avg sats 90 - 91%.  Required 2 sitting rest breaks.     PT Goals (current goals can now be found in the  care plan section)    Visit Information  Last PT Received On: 06/13/13 Assistance Needed: +1 History of Present Illness: 80 yo admitted with fever, nausea. Hx of emphysema, prostate cancer, colon rescetion.     Subjective Data      Cognition       Balance     End of Session PT - End of Session Equipment Utilized During Treatment: Gait belt Activity Tolerance: Patient limited by fatigue Patient left: in bed;with call bell/phone within reach;with family/visitor present   Rica Koyanagi  PTA [facility]  Acute  Rehab Pager      [phone]

## 2013-06-14 DIAGNOSIS — I059 Rheumatic mitral valve disease, unspecified: Secondary | ICD-10-CM

## 2013-06-14 LAB — CBC
HEMATOCRIT: 34 % — AB (ref 39.0–52.0)
Hemoglobin: 11.1 g/dL — ABNORMAL LOW (ref 13.0–17.0)
MCH: 32.9 pg (ref 26.0–34.0)
MCHC: 32.6 g/dL (ref 30.0–36.0)
MCV: 100.9 fL — AB (ref 78.0–100.0)
PLATELETS: 71 10*3/uL — AB (ref 150–400)
RBC: 3.37 MIL/uL — ABNORMAL LOW (ref 4.22–5.81)
RDW: 13.7 % (ref 11.5–15.5)
WBC: 5.5 10*3/uL (ref 4.0–10.5)

## 2013-06-14 LAB — BASIC METABOLIC PANEL
BUN: 57 mg/dL — ABNORMAL HIGH (ref 6–23)
CALCIUM: 8.4 mg/dL (ref 8.4–10.5)
CO2: 23 mEq/L (ref 19–32)
Chloride: 106 mEq/L (ref 96–112)
Creatinine, Ser: 2.88 mg/dL — ABNORMAL HIGH (ref 0.50–1.35)
GFR calc Af Amer: 22 mL/min — ABNORMAL LOW (ref >90)
GFR, EST NON AFRICAN AMERICAN: 19 mL/min — AB (ref >90)
GLUCOSE: 108 mg/dL — AB (ref 70–99)
Potassium: 4.6 mEq/L (ref 3.7–5.3)
Sodium: 141 mEq/L (ref 137–147)

## 2013-06-14 LAB — HEPARIN LEVEL (UNFRACTIONATED)
Heparin Unfractionated: <0.1 IU/mL — ABNORMAL LOW (ref 0.30–0.70)
Heparin Unfractionated: 0.35 IU/mL (ref 0.30–0.70)
Heparin Unfractionated: 0.35 IU/mL (ref 0.30–0.70)

## 2013-06-14 LAB — TSH: TSH: 5.075 u[IU]/mL — ABNORMAL HIGH (ref 0.350–4.500)

## 2013-06-14 MED ORDER — HEPARIN (PORCINE) IN NACL 100-0.45 UNIT/ML-% IJ SOLN
1350.0000 [IU]/h | INTRAMUSCULAR | Status: DC
  Administered 2013-06-14: 1350 [IU]/h via INTRAVENOUS
  Administered 2013-06-14: 1300 [IU]/h via INTRAVENOUS
  Filled 2013-06-14 (×3): qty 250

## 2013-06-14 NOTE — Progress Notes (Signed)
ANTICOAGULATION CONSULT NOTE - F/U Consult  Pharmacy Consult for Heparin Indication: atrial fibrillation  No Known Allergies  Patient Measurements: Height: 6\' 2"  (188 cm) Weight: 159 lb 9.8 oz (72.4 kg)  IBW/kg (Calculated) : 82.2  Vital Signs: Temp: 97.7 F (36.5 C) (02/28 2022) Temp src: Oral (02/28 2022) BP: 116/60 mmHg (02/28 2022) Pulse Rate: 70 (02/28 2022)  Labs:  Recent Labs  06/12/13 0405 06/13/13 0412 06/13/13 1708 06/14/13 0140 06/14/13 1302 06/14/13 2155  HGB 12.0* 11.5*  --  11.1*  --   --   HCT 36.3* 34.9*  --  34.0*  --   --   PLT 62* 65*  --  71*  --   --   APTT  --   --  105*  --   --   --   LABPROT  --   --  14.0  --   --   --   INR  --   --  1.10  --   --   --   HEPARINUNFRC  --   --   --  <0.10* 0.35 0.35  CREATININE 3.38* 3.19*  --  2.88*  --   --     Estimated Creatinine Clearance: 19.9 ml/min (by C-G formula based on Cr of 2.88).   Assessment: 42 yoM admitted 2/25 with fever, acute on chronic CKD, acute on chronic thrombocytopenia.  He was started on broad spectrum IV antibiotics for UTI, GN bacteremia, possible CAP.  PMH includes COPD, prostate CA, PVOD (s/p AAA repair), colon CA.  Pharmacy is consulted 2/27 to dose IV heparin for new onset Afib.  Heparin level = 0.35 with heparin infusing @ 1350 units/hr  CBC:  Hgb 11.1, Plt 71 (Known chronic thrombocytopenia)  SCr improved, 2.9 with CrCl ~ 20 ml/min  No bleeding or infusion line issues noted  Goal of Therapy:  Heparin level 0.3-0.7 units/ml Monitor platelets by anticoagulation protocol: Yes   Plan:   Continue heparin IV infusion to 1350 units/hr    Daily heparin level and CBC  Continue to monitor H&H and platelets  Leone Haven, PharmD  06/14/2013 10:34 PM

## 2013-06-14 NOTE — Progress Notes (Signed)
ANTICOAGULATION CONSULT NOTE - F/U Consult  Pharmacy Consult for Heparin Indication: atrial fibrillation  No Known Allergies  Patient Measurements: Height: 6\' 2"  (188 cm) Weight: 159 lb 9.8 oz (72.4 kg)  IBW/kg (Calculated) : 82.2  Vital Signs: Temp: 97.4 F (36.3 C) (02/28 0505) Temp src: Oral (02/28 0505) BP: 127/69 mmHg (02/28 0505) Pulse Rate: 71 (02/28 0505)  Labs:  Recent Labs  06/12/13 0405 06/13/13 0412 06/13/13 1708 06/14/13 0140 06/14/13 1302  HGB 12.0* 11.5*  --  11.1*  --   HCT 36.3* 34.9*  --  34.0*  --   PLT 62* 65*  --  71*  --   APTT  --   --  105*  --   --   LABPROT  --   --  14.0  --   --   INR  --   --  1.10  --   --   HEPARINUNFRC  --   --   --  <0.10* 0.35  CREATININE 3.38* 3.19*  --  2.88*  --     Estimated Creatinine Clearance: 19.9 ml/min (by C-G formula based on Cr of 2.88).   Assessment: 69 yoM admitted 2/25 with fever, acute on chronic CKD, acute on chronic thrombocytopenia.  He was started on broad spectrum IV antibiotics for UTI, GN bacteremia, possible CAP.  PMH includes COPD, prostate CA, PVOD (s/p AAA repair), colon CA.  Pharmacy is consulted 2/27 to dose IV heparin for new onset Afib.  Baseline coags WNL  1300 heparin level= 0.35, just inside therapeutic range on 1300 units/hr  CBC:  Hgb 11.1, Plt 71 (Known chronic thrombocytopenia)  SCr improved, 2.9 with CrCl ~ 20 ml/min  No bleeding or infusion line issues per RN  Goal of Therapy:  Heparin level 0.3-0.7 units/ml Monitor platelets by anticoagulation protocol: Yes   Plan:   Increase heparin IV infusion to 1350 units/hr (13 ml/hr)  Heparin level 8 hours after increasing  Daily heparin level and CBC  Continue to monitor H&H and platelets  Thank you for the consult.  Johny Drilling, PharmD, BCPS Clinical Pharmacist Pager: [phone] Pharmacy: [phone] 06/14/2013 1:49 PM

## 2013-06-14 NOTE — Progress Notes (Signed)
  Echocardiogram 2D Echocardiogram has been performed.  Derrick [name] 06/14/2013, 11:40 AM

## 2013-06-14 NOTE — Progress Notes (Signed)
PROGRESS NOTE  Derrick [name] XTG:[id number] DOB: Nov 10, 1928 DOA: 06/10/2013 PCP: Jerlyn Ly, MD Urology: [name]  Assessment/Plan:  1. UTI left pyelonephritis: Continue antibiotics, needs 14 day course. Repeat blood cultures in am to document clearance and consider transitioning to oral antibiotics.   2. GN bacteremia - speciated to E coli sensitive to Ciprofloxacin 3. A fib - this is new, tachycardic to 140s-160s overnight, seems like A fib w RVR vs flutter vs MAT. Cardiology consulted, appreciate input. Currently on a heparin drip, 2-D echo pending. 4. Possible community-acquired pneumonia: CT abdomen shows mild focal nodular opacity at the right lung base which may reflect a mild infectious process, doubt true pneumonia. Patient does have nonproductive cough which is new but no dyspnea. He is on room air.  5. Acute encephalopathy: Overnight after admission, patient became confused and slightly agitated in the context of fevers. Likely secondary to febrile illness. Improved. 6. Acute on stage III chronic kidney disease: Baseline creatinine? 2.4, stage III, has been having this since 2009 at least. Worsening 2/26 likely secondary to dehydration. Slowly improving on 2/27. Patient discussed with nephrology on 2/26 about his renal disease, they do not wish dialysis. Renal function improving, 2.88 this morning 7. Acute on chronic thrombocytopenia: No bleeding. Likely precipitated by acute infection. Follow CBCs. Mild leukopenia also secondary to same. Improving. 8. History of emphysema: Stable 9. History of prostate cancer: Outpatient followup with urology. I am trying to let [name] know he is here hospitalized.    Code Status: DNR   Family Communication: None this morning Disposition Plan: Home when medically stable  Consultants:  None  Procedures:  None  Antibiotics:  IV Zosyn 2/25 > 2/26  Cefepime 2/26>> 2/27  Ciprofloxacin 2/27>>  Subjective: - Patient feeling  better today.   Objective: Filed Vitals:   06/13/13 1306 06/13/13 2059 06/14/13 0505 06/14/13 1520  BP: 125/74 118/83 127/69 130/68  Pulse: 94 85 71 67  Temp: 97.2 F (36.2 C) 97.8 F (36.6 C) 97.4 F (36.3 C) 98.4 F (36.9 C)  TempSrc: Oral Oral Oral Oral  Resp: 18 18 18 16   Height:      Weight:      SpO2: 99% 97% 96% 99%    Intake/Output Summary (Last 24 hours) at 06/14/13 1751 Last data filed at 06/14/13 1448  Gross per 24 hour  Intake 709.02 ml  Output    500 ml  Net 209.02 ml   Filed Weights   06/11/13 1413  Weight: 72.4 kg (159 lb 9.8 oz)     Exam:  General exam: Elderly pleasant male lying comfortably in bed Respiratory system: Slightly reduced breath sounds in the bases but otherwise clear to auscultation. No increased work of breathing. Cardiovascular system: S1 & S2 heard, RRR. No JVD, murmurs, gallops, clicks or pedal edema. Gastrointestinal system: Abdomen is nondistended, soft and nontender. Normal bowel sounds heard. Central nervous system: Alert and oriented x2. No focal neurological deficits. Extremities: Symmetric 5 x 5 power.   Data Reviewed: Basic Metabolic Panel:  Recent Labs Lab 06/11/13 0020 06/11/13 0630 06/12/13 0405 06/13/13 0412 06/14/13 0140  NA 141 136* 138 142 141  K 4.6 4.5 4.5 4.1 4.6  CL 100 97 100 106 106  CO2 23 20 21 22 23   GLUCOSE 157* 150* 117* 120* 108*  BUN 43* 45* 59* 63* 57*  CREATININE 2.83* 2.82* 3.38* 3.19* 2.88*  CALCIUM 10.3 9.5 8.8 8.7 8.4  MG  --  1.9  --  2.1  --  PHOS  --  5.0*  --  4.3  --    Liver Function Tests:  Recent Labs Lab 06/11/13 0020  AST 41*  ALT 16  ALKPHOS 110  BILITOT 1.4*  PROT 7.5  ALBUMIN 3.7    Recent Labs Lab 06/11/13 0020  LIPASE 16   No results found for this basename: AMMONIA,  in the last 168 hours CBC:  Recent Labs Lab 06/11/13 0020 06/11/13 0630 06/12/13 0405 06/13/13 0412 06/14/13 0140  WBC 10.8* 3.3* 8.8 6.5 5.5  NEUTROABS 9.8*  --   --   --   --    HGB 13.4 13.3 12.0* 11.5* 11.1*  HCT 39.6 39.3 36.3* 34.9* 34.0*  MCV 99.0 99.2 100.3* 100.9* 100.9*  PLT 78* 60* 62* 65* 71*   Cardiac Enzymes:  Recent Labs Lab 06/11/13 0020  TROPONINI <0.30   BNP (last 3 results) No results found for this basename: PROBNP,  in the last 8760 hours CBG: No results found for this basename: GLUCAP,  in the last 168 hours  Recent Results (from the past 240 hour(s))  CULTURE, BLOOD (ROUTINE X 2)     Status: None   Collection Time    06/11/13 12:35 AM      Result Value Ref Range Status   Specimen Description BLOOD RIGHT ANTECUBITAL   Final   Special Requests BOTTLES DRAWN AEROBIC AND ANAEROBIC 5CC   Final   Culture  Setup Time     Final   Value: 06/11/2013 03:22     Performed at [facility]   Culture     Final   Value: ESCHERICHIA COLI     Note: SUSCEPTIBILITIES PERFORMED ON PREVIOUS CULTURE WITHIN THE LAST 5 DAYS.     Note: Gram Stain Report Called to,Read Back By and Verified With: SOPHIA PICKETT ON 06/11/2013 AT 8:40P BY WILEJ     Performed at [facility]   Report Status 06/13/2013 FINAL   Final  CULTURE, BLOOD (ROUTINE X 2)     Status: None   Collection Time    06/11/13 12:37 AM      Result Value Ref Range Status   Specimen Description BLOOD LEFT ANTECUBITAL   Final   Special Requests BOTTLES DRAWN AEROBIC AND ANAEROBIC 5CC   Final   Culture  Setup Time     Final   Value: 06/11/2013 03:22     Performed at [facility]   Culture     Final   Value: ESCHERICHIA COLI     Note: Gram Stain Report Called to,Read Back By and Verified With: SOPHIA PICKETT ON 06/11/2013 AT 8:40P BY WILEJ     Performed at [facility]   Report Status 06/13/2013 FINAL   Final   Organism ID, Bacteria ESCHERICHIA COLI   Final  URINE CULTURE     Status: None   Collection Time    06/11/13  1:59 AM      Result Value Ref Range Status   Specimen Description URINE, CLEAN CATCH   Final   Special Requests NONE   Final   Culture  Setup  Time     Final   Value: 06/11/2013 08:15     Performed at [facility]     Final   Value: >=100,000 COLONIES/ML     Performed at [facility]   Culture     Final   Value: ESCHERICHIA COLI     Performed at [facility]  Report Status 06/13/2013 FINAL   Final   Organism ID, Bacteria ESCHERICHIA COLI   Final      Studies: No results found.      Scheduled Meds: . ciprofloxacin  400 mg Intravenous Q24H  . pantoprazole  40 mg Oral Daily  . simvastatin  20 mg Oral Daily  . sodium chloride  3 mL Intravenous Q12H   Continuous Infusions: . heparin 1,350 Units/hr (06/14/13 1346)    Active Problems:   Fever   Pyelonephritis, acute   Community acquired pneumonia   Acute encephalopathy   Renal failure, acute on chronic   Thrombocytopenia   Atrial fibrillation with RVR  Time spent: 25 minutes  Marzetta Board, MD [facility] Pager [phone]  If 7PM-7AM, please contact night-coverage [email or web address] Password [facility] 06/14/2013, 5:51 PM    LOS: 4 days

## 2013-06-14 NOTE — Progress Notes (Signed)
Patient Name: Derrick [name]      SUBJECTIVE: 80 yo with history of COPD, prostate CA, PVOD (s/p AAA repair) Colon cancer,CKD Admitted on 2/25 with fever, abdominal pain. Started treatment for urine infeciton. Found to have secondary GN bacteremia But also possible CAPneumonia  ECG afib RVR  Echo pending  Past Medical History  Diagnosis Date  . Emphysema lung     Scheduled Meds:  Scheduled Meds: . ciprofloxacin  400 mg Intravenous Q24H  . pantoprazole  40 mg Oral Daily  . simvastatin  20 mg Oral Daily  . sodium chloride  3 mL Intravenous Q12H   Continuous Infusions: . heparin 1,300 Units/hr (06/14/13 0438)    PHYSICAL EXAM Filed Vitals:   06/12/13 2233 06/13/13 1306 06/13/13 2059 06/14/13 0505  BP: 134/73 125/74 118/83 127/69  Pulse: 76 94 85 71  Temp: 98 F (36.7 C) 97.2 F (36.2 C) 97.8 F (36.6 C) 97.4 F (36.3 C)  TempSrc: Oral Oral Oral Oral  Resp: 18 18 18 18   Height:      Weight:      SpO2: 98% 99% 97% 96%    Well developed and nourished in no acute distress HENT normal Neck supple with JVP-flat Clear Irregular rate and rhythm, no murmurs or gallops Abd-soft with active BS No Clubbing cyanosis edema Skin-warm and dry A & Oriented  Grossly normal sensory and motor function  TELEMETRY: Reviewed telemetry pt in nsr    Intake/Output Summary (Last 24 hours) at 06/14/13 1047 Last data filed at 06/14/13 0506  Gross per 24 hour  Intake    280 ml  Output    500 ml  Net   -220 ml    LABS: Basic Metabolic Panel:  Recent Labs Lab 06/11/13 0020 06/11/13 0630 06/12/13 0405 06/13/13 0412 06/14/13 0140  NA 141 136* 138 142 141  K 4.6 4.5 4.5 4.1 4.6  CL 100 97 100 106 106  CO2 23 20 21 22 23   GLUCOSE 157* 150* 117* 120* 108*  BUN 43* 45* 59* 63* 57*  CREATININE 2.83* 2.82* 3.38* 3.19* 2.88*  CALCIUM 10.3 9.5 8.8 8.7 8.4  MG  --  1.9  --  2.1  --   PHOS  --  5.0*  --  4.3  --    Cardiac Enzymes: No results found for this  basename: CKTOTAL, CKMB, CKMBINDEX, TROPONINI,  in the last 72 hours CBC:  Recent Labs Lab 06/11/13 0020 06/11/13 0630 06/12/13 0405 06/13/13 0412 06/14/13 0140  WBC 10.8* 3.3* 8.8 6.5 5.5  NEUTROABS 9.8*  --   --   --   --   HGB 13.4 13.3 12.0* 11.5* 11.1*  HCT 39.6 39.3 36.3* 34.9* 34.0*  MCV 99.0 99.2 100.3* 100.9* 100.9*  PLT 78* 60* 62* 65* 71*   PROTIME:  Recent Labs  06/13/13 1708  LABPROT 14.0  INR 1.10   Liver Function Tests: No results found for this basename: AST, ALT, ALKPHOS, BILITOT, PROT, ALBUMIN,  in the last 72 hours No results found for this basename: LIPASE, AMYLASE,  in the last 72 hours BNP: BNP (last 3 results) No results found for this basename: PROBNP,  in the last 8760 hours D-Dimer: No results found for this basename: DDIMER,  in the last 72 hours Hemoglobin A1C: No results found for this basename: HGBA1C,  in the last 72 hours Fasting Lipid Panel: No results found for this basename: CHOL, HDL, LDLCALC, TRIG, CHOLHDL, LDLDIRECT,  in  the last 72 hours Thyroid Function Tests:  Recent Labs  06/13/13 1708  TSH 5.075*   Anemia Panel: No results found for this basename: VITAMINB12, FOLATE, FERRITIN, TIBC, IRON, RETICCTPCT,  in the last 72 hours     ASSESSMENT AND PLAN:  Active Problems:   Fever   Pyelonephritis, acute   Community acquired pneumonia   Acute encephalopathy   Renal failure, acute on chronic   Thrombocytopenia   Atrial fibrillation with RVR  Resumed sinus with pvc Will contniue hep pending echo(see below) Pt unaware of afib so have instructed his wife to take pulse regularly and if abnormalities noted, he would need ECG  For now will not begin anticoagulation as it is reasonable to assume 2/2 urosepsis Await echo>> if significant LAE then we might infer hx of afib  Signed, Virl Axe MD  06/14/2013

## 2013-06-14 NOTE — Progress Notes (Signed)
ANTICOAGULATION CONSULT NOTE - F/U Consult  Pharmacy Consult for Heparin Indication: atrial fibrillation  No Known Allergies  Patient Measurements: Height: 6\' 2"  (188 cm) (Simultaneous filing. User may not have seen previous data.) Weight: 159 lb 9.8 oz (72.4 kg) (Simultaneous filing. User may not have seen previous data.) IBW/kg (Calculated) : 82.2  Vital Signs: Temp: 97.8 F (36.6 C) (02/27 2059) Temp src: Oral (02/27 2059) BP: 118/83 mmHg (02/27 2059) Pulse Rate: 85 (02/27 2059)  Labs:  Recent Labs  06/12/13 0405 06/13/13 0412 06/13/13 1708 06/14/13 0140  HGB 12.0* 11.5*  --  11.1*  HCT 36.3* 34.9*  --  34.0*  PLT 62* 65*  --  71*  APTT  --   --  105*  --   LABPROT  --   --  14.0  --   INR  --   --  1.10  --   HEPARINUNFRC  --   --   --  <0.10*  CREATININE 3.38* 3.19*  --  2.88*    Estimated Creatinine Clearance: 19.9 ml/min (by C-G formula based on Cr of 2.88).   Medical History: Past Medical History  Diagnosis Date  . Emphysema lung     Medications:  Scheduled:  . ciprofloxacin  400 mg Intravenous Q24H  . pantoprazole  40 mg Oral Daily  . simvastatin  20 mg Oral Daily  . sodium chloride  3 mL Intravenous Q12H   Infusions:  . heparin      Assessment: 34 yoM admitted 2/25 with fever, acute on chronic CKD, acute on chronic thrombocytopenia.  He was started on broad spectrum IV antibiotics for UTI, GN bacteremia, possible CAP.  PMH includes COPD, prostate CA, PVOD (s/p AAA repair), colon CA.  Pharmacy is consulted 2/27 to dose IV heparin for new onset Afib.  Baseline coags pending  CBC:  Hgb 11.5, Plt 71 (Plt 78 on admission>65.  Known acute on chronic thrombocytopenia.  Pt was given one dose of Lovenox for VTE prophylaxis on 2/25.)  SCr 2.9 with CrCl ~ 20 ml/min  Goal of Therapy:  Heparin level 0.3-0.7 units/ml Monitor platelets by anticoagulation protocol: Yes   Plan:   Increase heparin IV infusion to  1300 units/hr (13 ml/hr)  Heparin  level 8 hours after increasing  Daily heparin level and CBC  Continue to monitor H&H and platelets    Dorrene German 06/14/2013 4:19 AM

## 2013-06-15 DIAGNOSIS — A419 Sepsis, unspecified organism: Secondary | ICD-10-CM | POA: Diagnosis present

## 2013-06-15 LAB — BASIC METABOLIC PANEL
BUN: 46 mg/dL — AB (ref 6–23)
CHLORIDE: 106 meq/L (ref 96–112)
CO2: 22 mEq/L (ref 19–32)
CREATININE: 2.4 mg/dL — AB (ref 0.50–1.35)
Calcium: 8.5 mg/dL (ref 8.4–10.5)
GFR, EST AFRICAN AMERICAN: 27 mL/min — AB (ref >90)
GFR, EST NON AFRICAN AMERICAN: 23 mL/min — AB (ref >90)
Glucose, Bld: 101 mg/dL — ABNORMAL HIGH (ref 70–99)
Potassium: 4.3 mEq/L (ref 3.7–5.3)
Sodium: 141 mEq/L (ref 137–147)

## 2013-06-15 LAB — CBC
HCT: 32.9 % — ABNORMAL LOW (ref 39.0–52.0)
Hemoglobin: 11 g/dL — ABNORMAL LOW (ref 13.0–17.0)
MCH: 33.4 pg (ref 26.0–34.0)
MCHC: 33.4 g/dL (ref 30.0–36.0)
MCV: 100 fL (ref 78.0–100.0)
Platelets: 87 10*3/uL — ABNORMAL LOW (ref 150–400)
RBC: 3.29 MIL/uL — ABNORMAL LOW (ref 4.22–5.81)
RDW: 13.5 % (ref 11.5–15.5)
WBC: 5.6 10*3/uL (ref 4.0–10.5)

## 2013-06-15 LAB — HEPARIN LEVEL (UNFRACTIONATED): Heparin Unfractionated: 0.45 IU/mL (ref 0.30–0.70)

## 2013-06-15 MED ORDER — CIPROFLOXACIN HCL 500 MG PO TABS: 500.0000 mg | ORAL_TABLET | Freq: Two times a day (BID) | ORAL | Status: DC

## 2013-06-15 MED ORDER — LORAZEPAM 0.5 MG PO TABS
0.5000 mg | ORAL_TABLET | Freq: Once | ORAL | Status: AC
  Administered 2013-06-15: 0.5 mg via ORAL
  Filled 2013-06-15: qty 1

## 2013-06-15 MED ORDER — CIPROFLOXACIN HCL 500 MG PO TABS
500.0000 mg | ORAL_TABLET | Freq: Every day | ORAL | Status: DC
  Administered 2013-06-16: 500 mg via ORAL
  Filled 2013-06-15 (×2): qty 1

## 2013-06-15 NOTE — Progress Notes (Signed)
ANTICOAGULATION CONSULT NOTE - F/U Consult  Pharmacy Consult for Heparin Indication: atrial fibrillation  No Known Allergies  Patient Measurements: Height: 6\' 2"  (188 cm) Weight: 159 lb 9.8 oz (72.4 kg)  IBW/kg (Calculated) : 82.2  Vital Signs: Temp: 97.4 F (36.3 C) (03/01 0428) Temp src: Oral (03/01 0428) BP: 126/78 mmHg (03/01 0428) Pulse Rate: 63 (03/01 0428)  Labs:  Recent Labs  06/13/13 0412 06/13/13 1708  06/14/13 0140 06/14/13 1302 06/14/13 2155 06/15/13 0515  HGB 11.5*  --   --  11.1*  --   --  11.0*  HCT 34.9*  --   --  34.0*  --   --  32.9*  PLT 65*  --   --  71*  --   --  87*  APTT  --  105*  --   --   --   --   --   LABPROT  --  14.0  --   --   --   --   --   INR  --  1.10  --   --   --   --   --   HEPARINUNFRC  --   --   < > <0.10* 0.35 0.35 0.45  CREATININE 3.19*  --   --  2.88*  --   --  2.40*  < > = values in this interval not displayed.  Estimated Creatinine Clearance: 23.9 ml/min (by C-G formula based on Cr of 2.4).   Assessment: 71 yoM admitted 2/25 with fever, acute on chronic CKD, acute on chronic thrombocytopenia.  He was started on broad spectrum IV antibiotics for UTI, GN bacteremia, possible CAP.  PMH includes COPD, prostate CA, PVOD (s/p AAA repair), colon CA.  Pharmacy is consulted 2/27 to dose IV heparin for new onset Afib.  Heparin level = 0.45 with heparin infusing @ 1350 units/hr  CBC:  Hgb 11.0- stable, Plt 87- rising (Known chronic thrombocytopenia)  SCr slightly improved, 2.44 with CrCl ~ 24 ml/min  No bleeding or infusion line issues noted  Per cardiology note 2/28: suspect Afib 2/2 urosepsis, will not start PO anticoagulation yet, await ECHO results  Goal of Therapy:  Heparin level 0.3-0.7 units/ml Monitor platelets by anticoagulation protocol: Yes   Plan:   Continue heparin IV infusion at 1350 units/hr    Daily heparin level and CBC  Continue to monitor H&H and platelets  Follow-up plans for continued  anticoagulation  Thank you for the consult.  Johny Drilling, PharmD, BCPS Pager: [phone] Pharmacy: [phone] 06/15/2013 9:02 AM

## 2013-06-15 NOTE — Progress Notes (Addendum)
PROGRESS NOTE  Derrick [name] VQM:[id number] DOB: 11/05/1928 DOA: 06/10/2013 PCP: Jerlyn Ly, MD Urology: [name]  Assessment/Plan:  UTI left pyelonephritis: Continue antibiotics, needs 14 day course. Repeat blood cultures negative, transition to oral agents today.  E coli bacteremia - from urinary source, sensitive to Ciprofloxacin, transition to oral today and monitor, if stable home tomorrow.  Sepsis, present on admission A fib - one time episode, converted back to sinus and has remained in sinus since, likely due to sepsis. Stopped heparin per cardiology, if A fib recurrent then will need anticoagulation. Appreciated cardiology consult, follow up as an outpatient.  Possible community-acquired pneumonia: CT abdomen shows mild focal nodular opacity at the right lung base which may reflect a mild infectious process, doubt true pneumonia. Patient does have nonproductive cough which is new but no dyspnea. He is on room air.  Acute encephalopathy: Overnight after admission, patient became confused and slightly agitated in the context of fevers. Likely secondary to febrile illness. Improved and now at baseline.  Acute on stage III chronic kidney disease: Baseline creatinine? 2.4, stage III, has been having this since 2009 at least. Worsening 2/26 likely secondary to dehydration. Slowly improving on 2/27. Patient discussed with nephrology on 2/26 about his renal disease, they do not wish dialysis. Renal function improving. Acute on chronic thrombocytopenia: No bleeding. Likely precipitated by acute infection. Follow CBCs. Mild leukopenia also secondary to same. Improving. History of emphysema: Stable History of prostate cancer: Outpatient followup with urology.    Code Status: DNR   Family Communication: Wife at bedside Disposition Plan: Home when medically stable  Consultants:  Cardiology  Nephrology  Procedures:  2D echo  Antibiotics:  IV Zosyn 2/25 > 2/26  IV Cefepime  2/26>> 2/27  IV Ciprofloxacin 2/27>>  PO Ciprofloxacin 2/27 >>  Subjective: - Patient feeling better today.   Objective: Filed Vitals:   06/14/13 0505 06/14/13 1520 06/14/13 2022 06/15/13 0428  BP: 127/69 130/68 116/60 126/78  Pulse: 71 67 70 63  Temp: 97.4 F (36.3 C) 98.4 F (36.9 C) 97.7 F (36.5 C) 97.4 F (36.3 C)  TempSrc: Oral Oral Oral Oral  Resp: 18 16 16 16   Height:      Weight:      SpO2: 96% 99% 98% 97%    Intake/Output Summary (Last 24 hours) at 06/15/13 0924 Last data filed at 06/15/13 9326  Gross per 24 hour  Intake 1159.85 ml  Output    700 ml  Net 459.85 ml   Filed Weights   06/11/13 1413  Weight: 72.4 kg (159 lb 9.8 oz)     Exam:  General exam: Elderly pleasant male lying comfortably in bed Respiratory system: Slightly reduced breath sounds in the bases but otherwise clear to auscultation. No increased work of breathing. Cardiovascular system: S1 & S2 heard, RRR. No JVD, murmurs, gallops, clicks or pedal edema. Gastrointestinal system: Abdomen is nondistended, soft and nontender. Normal bowel sounds heard. Central nervous system: Alert and oriented x2. No focal neurological deficits. Extremities: Symmetric 5 x 5 power.   Data Reviewed: Basic Metabolic Panel:  Recent Labs Lab 06/11/13 0020 06/11/13 0630 06/12/13 0405 06/13/13 0412 06/14/13 0140 06/15/13 0515  NA 141 136* 138 142 141 141  K 4.6 4.5 4.5 4.1 4.6 4.3  CL 100 97 100 106 106 106  CO2 23 20 21 22 23 22   GLUCOSE 157* 150* 117* 120* 108* 101*  BUN 43* 45* 59* 63* 57* 46*  CREATININE 2.83* 2.82* 3.38* 3.19* 2.88*  2.40*  CALCIUM 10.3 9.5 8.8 8.7 8.4 8.5  MG  --  1.9  --  2.1  --   --   PHOS  --  5.0*  --  4.3  --   --    Liver Function Tests:  Recent Labs Lab 06/11/13 0020  AST 41*  ALT 16  ALKPHOS 110  BILITOT 1.4*  PROT 7.5  ALBUMIN 3.7    Recent Labs Lab 06/11/13 0020  LIPASE 16   CBC:  Recent Labs Lab 06/11/13 0020 06/11/13 0630 06/12/13 0405  06/13/13 0412 06/14/13 0140 06/15/13 0515  WBC 10.8* 3.3* 8.8 6.5 5.5 5.6  NEUTROABS 9.8*  --   --   --   --   --   HGB 13.4 13.3 12.0* 11.5* 11.1* 11.0*  HCT 39.6 39.3 36.3* 34.9* 34.0* 32.9*  MCV 99.0 99.2 100.3* 100.9* 100.9* 100.0  PLT 78* 60* 62* 65* 71* 87*   Cardiac Enzymes:  Recent Labs Lab 06/11/13 0020  TROPONINI <0.30    Recent Results (from the past 240 hour(s))  CULTURE, BLOOD (ROUTINE X 2)     Status: None   Collection Time    06/11/13 12:35 AM      Result Value Ref Range Status   Specimen Description BLOOD RIGHT ANTECUBITAL   Final   Special Requests BOTTLES DRAWN AEROBIC AND ANAEROBIC 5CC   Final   Culture  Setup Time     Final   Value: 06/11/2013 03:22     Performed at [facility]   Culture     Final   Value: ESCHERICHIA COLI     Note: SUSCEPTIBILITIES PERFORMED ON PREVIOUS CULTURE WITHIN THE LAST 5 DAYS.     Note: Gram Stain Report Called to,Read Back By and Verified With: SOPHIA PICKETT ON 06/11/2013 AT 8:40P BY WILEJ     Performed at [facility]   Report Status 06/13/2013 FINAL   Final  CULTURE, BLOOD (ROUTINE X 2)     Status: None   Collection Time    06/11/13 12:37 AM      Result Value Ref Range Status   Specimen Description BLOOD LEFT ANTECUBITAL   Final   Special Requests BOTTLES DRAWN AEROBIC AND ANAEROBIC 5CC   Final   Culture  Setup Time     Final   Value: 06/11/2013 03:22     Performed at [facility]   Culture     Final   Value: ESCHERICHIA COLI     Note: Gram Stain Report Called to,Read Back By and Verified With: SOPHIA PICKETT ON 06/11/2013 AT 8:40P BY WILEJ     Performed at [facility]   Report Status 06/13/2013 FINAL   Final   Organism ID, Bacteria ESCHERICHIA COLI   Final  URINE CULTURE     Status: None   Collection Time    06/11/13  1:59 AM      Result Value Ref Range Status   Specimen Description URINE, CLEAN CATCH   Final   Special Requests NONE   Final   Culture  Setup Time     Final    Value: 06/11/2013 08:15     Performed at [facility]     Final   Value: >=100,000 COLONIES/ML     Performed at [facility]   Culture     Final   Value: ESCHERICHIA COLI     Performed at [facility]   Report Status 06/13/2013 FINAL  Final   Organism ID, Bacteria ESCHERICHIA COLI   Final    Scheduled Meds: . ciprofloxacin  400 mg Intravenous Q24H  . pantoprazole  40 mg Oral Daily  . simvastatin  20 mg Oral Daily  . sodium chloride  3 mL Intravenous Q12H   Continuous Infusions: . heparin 1,350 Units/hr (06/14/13 1900)    Active Problems:   Fever   Pyelonephritis, acute   Community acquired pneumonia   Acute encephalopathy   Renal failure, acute on chronic   Thrombocytopenia   Atrial fibrillation with RVR   Sepsis  Time spent: 35 minutes  Marzetta Board, MD [facility] Pager [phone]  If 7PM-7AM, please contact night-coverage [email or web address] Password [email or web address] 06/15/2013, 9:24 AM    LOS: 5 days

## 2013-06-15 NOTE — Progress Notes (Addendum)
Patient Name: Derrick [name]      SUBJECTIVE: 80 yo with history of COPD, prostate CA, PVOD (s/p AAA repair) Colon cancer,CKD Admitted on 2/25 with fever, abdominal pain. Started treatment for urine infeciton. Found to have secondary GN bacteremia But also possible CAPneumonia  ECG afib RVR  Echo normal LV function and normal left atrial size   Past Medical History  Diagnosis Date  . Emphysema lung     Scheduled Meds:  Scheduled Meds: . ciprofloxacin  400 mg Intravenous Q24H  . pantoprazole  40 mg Oral Daily  . simvastatin  20 mg Oral Daily  . sodium chloride  3 mL Intravenous Q12H   Continuous Infusions: . heparin 1,350 Units/hr (06/14/13 1900)    PHYSICAL EXAM Filed Vitals:   06/14/13 0505 06/14/13 1520 06/14/13 2022 06/15/13 0428  BP: 127/69 130/68 116/60 126/78  Pulse: 71 67 70 63  Temp: 97.4 F (36.3 C) 98.4 F (36.9 C) 97.7 F (36.5 C) 97.4 F (36.3 C)  TempSrc: Oral Oral Oral Oral  Resp: 18 16 16 16   Height:      Weight:      SpO2: 96% 99% 98% 97%    Well developed and nourished in no acute distress HENT normal Neck supple   Clear Irregular rate and rhythm, no murmurs or gallops Abd-soft with active BS No Clubbing cyanosis edema Skin-warm and dry A & Oriented  Grossly normal sensory and motor function  TELEMETRY: Reviewed telemetry pt in nsr    Intake/Output Summary (Last 24 hours) at 06/15/13 1004 Last data filed at 06/15/13 0837  Gross per 24 hour  Intake 1159.85 ml  Output    700 ml  Net 459.85 ml    LABS: Basic Metabolic Panel:  Recent Labs Lab 06/11/13 0020 06/11/13 0630 06/12/13 0405 06/13/13 0412 06/14/13 0140 06/15/13 0515  NA 141 136* 138 142 141 141  K 4.6 4.5 4.5 4.1 4.6 4.3  CL 100 97 100 106 106 106  CO2 23 20 21 22 23 22   GLUCOSE 157* 150* 117* 120* 108* 101*  BUN 43* 45* 59* 63* 57* 46*  CREATININE 2.83* 2.82* 3.38* 3.19* 2.88* 2.40*  CALCIUM 10.3 9.5 8.8 8.7 8.4 8.5  MG  --  1.9  --  2.1  --    --   PHOS  --  5.0*  --  4.3  --   --    Cardiac Enzymes: No results found for this basename: CKTOTAL, CKMB, CKMBINDEX, TROPONINI,  in the last 72 hours CBC:  Recent Labs Lab 06/11/13 0020 06/11/13 0630 06/12/13 0405 06/13/13 0412 06/14/13 0140 06/15/13 0515  WBC 10.8* 3.3* 8.8 6.5 5.5 5.6  NEUTROABS 9.8*  --   --   --   --   --   HGB 13.4 13.3 12.0* 11.5* 11.1* 11.0*  HCT 39.6 39.3 36.3* 34.9* 34.0* 32.9*  MCV 99.0 99.2 100.3* 100.9* 100.9* 100.0  PLT 78* 60* 62* 65* 71* 87*   PROTIME:  Recent Labs  06/13/13 1708  LABPROT 14.0  INR 1.10   Liver Function Tests: No results found for this basename: AST, ALT, ALKPHOS, BILITOT, PROT, ALBUMIN,  in the last 72 hours No results found for this basename: LIPASE, AMYLASE,  in the last 72 hours BNP: BNP (last 3 results) No results found for this basename: PROBNP,  in the last 8760 hours D-Dimer: No results found for this basename: DDIMER,  in the last 72 hours Hemoglobin A1C: No  results found for this basename: HGBA1C,  in the last 72 hours Fasting Lipid Panel: No results found for this basename: CHOL, HDL, LDLCALC, TRIG, CHOLHDL, LDLDIRECT,  in the last 72 hours Thyroid Function Tests:  Recent Labs  06/13/13 1708  TSH 5.075*   Anemia Panel: No results found for this basename: VITAMINB12, FOLATE, FERRITIN, TIBC, IRON, RETICCTPCT,  in the last 72 hours     ASSESSMENT AND PLAN:  Active Problems:   Fever   Pyelonephritis, acute   Community acquired pneumonia   Acute encephalopathy   Renal failure, acute on chronic   Thrombocytopenia   Atrial fibrillation with RVR  Resumed sinus with pvc Echo normal with nl LA size making lots of afib(anyway) unlikely Can discharge will arrange followup with me in about 4 weeks  Signed, Virl Axe MD  06/15/2013

## 2013-06-16 ENCOUNTER — Telehealth: Payer: Self-pay | Admitting: Internal Medicine

## 2013-06-16 LAB — CBC
HCT: 33.5 % — ABNORMAL LOW (ref 39.0–52.0)
HEMOGLOBIN: 11.2 g/dL — AB (ref 13.0–17.0)
MCH: 33.2 pg (ref 26.0–34.0)
MCHC: 33.4 g/dL (ref 30.0–36.0)
MCV: 99.4 fL (ref 78.0–100.0)
Platelets: 98 10*3/uL — ABNORMAL LOW (ref 150–400)
RBC: 3.37 MIL/uL — AB (ref 4.22–5.81)
RDW: 13.4 % (ref 11.5–15.5)
WBC: 5.1 10*3/uL (ref 4.0–10.5)

## 2013-06-16 LAB — HEPARIN LEVEL (UNFRACTIONATED): Heparin Unfractionated: <0.1 IU/mL — ABNORMAL LOW (ref 0.30–0.70)

## 2013-06-16 MED ORDER — ZOLPIDEM TARTRATE 5 MG PO TABS
5.0000 mg | ORAL_TABLET | Freq: Once | ORAL | Status: AC
  Administered 2013-06-16: 5 mg via ORAL
  Filled 2013-06-16: qty 1

## 2013-06-16 MED ORDER — CIPROFLOXACIN HCL 500 MG PO TABS: 500.0000 mg | ORAL_TABLET | Freq: Every day | ORAL | Status: DC

## 2013-06-16 NOTE — Progress Notes (Signed)
[facility]  [facility] is providing the following services: RW  If patient discharges after hours, please call [phone].   Linward Headland 06/16/2013, 9:35 AM

## 2013-06-16 NOTE — Progress Notes (Signed)
Wife at bedside selected [facility]. Referral given to in house rep with [facility].

## 2013-06-16 NOTE — Telephone Encounter (Signed)
New Problem:   Pt's wife is asking if her husband needs to set up an appt w/ Dr. Caryl Comes post hosptial... I don't see any instructions for the pt to follow up w/ Dr. Caryl Comes. According to Epic, he did see the pt in the hospital. However, Dr. Harrington Challenger was the one that consulted on the pt. She did not mention a f/u with her either... Pt's wife is requesting a call back from the nurse.

## 2013-06-16 NOTE — Discharge Summary (Signed)
Physician Discharge Summary  Derrick [name] EVO:[id number] DOB: 1928/07/13 DOA: 06/10/2013  PCP: Jerlyn Ly, MD  Admit date: 06/10/2013 Discharge date: 06/16/2013  Time spent: 35 minutes  Recommendations for Outpatient Follow-up:  1. Follow up with Dr. Joylene Draft in 3-4 days 2. Follow up with [name] as needed   Recommendations for primary care physician for things to follow:  Post hospital evaluation Repeat BMP  Discharge Diagnoses:  Active Problems:   Fever   Pyelonephritis, acute   Community acquired pneumonia   Acute encephalopathy   Renal failure, acute on chronic   Thrombocytopenia   Atrial fibrillation with RVR   Sepsis  Discharge Condition: Stable  Diet recommendation: Heart healthy  Filed Weights   06/11/13 1413  Weight: 72.4 kg (159 lb 9.8 oz)   History of present illness:  80 year-old male with a past medical history of emphysema, prostate cancer, colon resection with reastomosis, presenting the Emergency Department with main concern of several days duration of fevers, T max 101 F, associated with lower abd discomfort, throbbing and 5/10 in severity, non radiating. No specific alleviating or aggravating factors, also associated with urinary urgency and frequency, nausea and on bloody vomiting, No recent sick contacts or exposures, no similar events in the past. In ED, Tmax 103 F, pt's UA worrisome for UTI, started on Levaquin and TRH asked to admit to telemetry bed.  Hospital Course:  UTI left pyelonephritis: Continue antibiotics, needs 14 day course. Repeat blood cultures negative, transition to ciprofloxacin twice daily.  E coli bacteremia - from urinary source, sensitive to Ciprofloxacin. Surveillance blood cultures have remained negative at the time of discharge, final report is still pending.  Sepsis, present on admission  A fib with RVR- one time episode, converted back to sinus and has remained in sinus since, likely due to sepsis. Stopped heparin per  cardiology, if A fib recurrent then will need anticoagulation. Appreciated cardiology consult, follow up as an outpatient.  Possible community-acquired pneumonia - unlikely: CT abdomen shows mild focal nodular opacity at the right lung base which may reflect a mild infectious process, doubt true pneumonia. Patient does have nonproductive cough which is new but no dyspnea. He is on room air.  Acute encephalopathy: Overnight after admission, patient became confused and slightly agitated in the context of fevers. Likely secondary to febrile illness. Improved and now at baseline.  Acute on stage III chronic kidney disease: Baseline creatinine? 2.4, stage III, has been having this since 2009 at least. Worsening 2/26 likely secondary to dehydration. Slowly improving on 2/27. Patient discussed with nephrology on 2/26 about his renal disease, they do not wish dialysis. Renal function improving.  Acute on chronic thrombocytopenia: No bleeding. Likely precipitated by acute infection. Follow CBCs. Mild leukopenia also secondary to same. Improving.  History of emphysema: Stable  History of prostate cancer: Outpatient followup with urology.   Procedures:  none   Consultations:  Nephrology  Cardiology  Discharge Exam: Filed Vitals:   06/15/13 1140 06/15/13 1333 06/15/13 2006 06/16/13 0520  BP:  127/63 142/89 131/68  Pulse: 67 65 70 67  Temp:  97.5 F (36.4 C) 97.8 F (36.6 C) 98.2 F (36.8 C)  TempSrc:  Oral Oral Oral  Resp: 18 18 20 18   Height:      Weight:      SpO2: 98% 97% 96% 96%   General: No acute distress Cardiovascular: Regular rate and rhythm Respiratory: Clear to auscultation bilaterally  Discharge Instructions    Medication List  ciprofloxacin 500 MG tablet  Commonly known as:  CIPRO  Take 1 tablet (500 mg total) by mouth daily with breakfast.     PRILOSEC OTC 20 MG tablet  Generic drug:  omeprazole  Take 20 mg by mouth daily.     simvastatin 20 MG tablet    Commonly known as:  ZOCOR  Take 20 mg by mouth daily.           Follow-up Information   Follow up with PERINI,MARK A, MD. Schedule an appointment as soon as possible for a visit in 1 week.   Specialty:  Internal Medicine   Contact information:   [place] [address] [phone]       Follow up with [facility]. (for HHPT)    Contact information:   [address] [place] [address] [phone]       The results of significant diagnostics from this hospitalization (including imaging, microbiology, ancillary and laboratory) are listed below for reference.    Significant Diagnostic Studies: Ct Abdomen Pelvis Wo Contrast  06/11/2013   CLINICAL DATA:  Fever, nausea and vomiting.  Leukocytosis.  EXAM: CT ABDOMEN AND PELVIS WITHOUT CONTRAST  TECHNIQUE: Multidetector CT imaging of the abdomen and pelvis was performed following the standard protocol without intravenous contrast.  COMPARISON:  CT of the abdomen and pelvis performed 07/25/2010  FINDINGS: Mild focal nodular opacity at the right lung base may reflect a mild infectious process; an underlying suspicious nodule cannot be excluded, but is not well characterized due to motion artifact.  A small-to-moderate hiatal hernia is seen.  The liver and spleen are unremarkable in appearance. The gallbladder is within normal limits. The pancreas and adrenal glands are unremarkable.  Numerous large bilateral renal cysts are seen. Nonspecific perinephric stranding is noted on the left side. This raises concern for left-sided pyelonephritis. Mild bilateral renal atrophy is noted. There is no evidence of hydronephrosis. No renal or ureteral stones are seen.  No free fluid is identified. The small bowel is unremarkable in appearance. The stomach is within normal limits. No acute vascular abnormalities are seen. Scattered calcification is seen along the abdominal aorta and its branches. The patient is status post  aortoiliac stent graft. Prominent bilateral common femoral artery aneurysms are seen, measuring 4.1 cm on the right and 3.4 cm on the left.  The appendix is not definitely seen; there is no evidence for appendicitis. The colon is unremarkable in appearance.  The bladder is mildly distended and grossly unremarkable in appearance. The patient is status post prostatectomy. No inguinal lymphadenopathy is seen.  No acute osseous abnormalities are identified. Degenerative change is noted along the lumbar spine, with multilevel vacuum phenomenon.  IMPRESSION: 1. Nonspecific perinephric stranding noted about the left kidney, new from the prior study. This could reflect mild left-sided pyelonephritis. Would correlate clinically for associated symptoms. 2. Numerous large bilateral renal cysts again seen, with mild bilateral renal atrophy. 3. Mild focal nodular opacity at the right lung base may reflect a mild infectious process; an underlying suspicious nodule cannot be excluded, but is not well characterized due to motion artifact. CT of the chest could be considered for further evaluation, on an elective nonemergent basis. 4. Small to moderate hiatal hernia seen. 5. Per prominent bilateral common femoral arteries noted, measuring 4.1 cm on the right and 3.4 cm on the left. These have increased in size from 2012.   Electronically Signed   By: Francoise Schaumann.D.  On: 06/11/2013 04:17   Dg Chest 2 View  06/11/2013   CLINICAL DATA:  80 year old male with hypoxia.  EXAM: CHEST  2 VIEW  COMPARISON:  11/13/2011  FINDINGS: Cardiomegaly noted.  A moderate hiatal hernia again identified.  There is no evidence of focal airspace disease, pulmonary edema, suspicious pulmonary nodule/mass, pleural effusion, or pneumothorax. No acute bony abnormalities are identified.  Mid thoracic compression fractures are unchanged.  IMPRESSION: Cardiomegaly without evidence of acute cardiopulmonary disease.  Moderate hiatal hernia.    Electronically Signed   By: Hassan Rowan M.D.   On: 06/11/2013 01:21   Dg Abd 2 Views  06/11/2013   CLINICAL DATA:  Vomiting and abdominal pain.  EXAM: ABDOMEN - 2 VIEW  COMPARISON:  Abdominal radiograph performed 07/25/2010  FINDINGS: The visualized bowel gas pattern is unremarkable. Scattered air and stool filled loops of colon are seen; no abnormal dilatation of small bowel loops is seen to suggest small bowel obstruction. No free intra-abdominal air is identified on the provided decubitus view. Clips are noted within the right upper quadrant, reflecting prior cholecystectomy.  Mild left convex lumbar scoliosis is noted; the sacroiliac joints are unremarkable in appearance. The visualized lung bases are essentially clear.  IMPRESSION: Unremarkable bowel gas pattern; no free intra-abdominal air seen.   Electronically Signed   By: Garald Balding M.D.   On: 06/11/2013 01:39    Microbiology: Recent Results (from the past 240 hour(s))  CULTURE, BLOOD (ROUTINE X 2)     Status: None   Collection Time    06/11/13 12:35 AM      Result Value Ref Range Status   Specimen Description BLOOD RIGHT ANTECUBITAL   Final   Special Requests BOTTLES DRAWN AEROBIC AND ANAEROBIC 5CC   Final   Culture  Setup Time     Final   Value: 06/11/2013 03:22     Performed at [facility]   Culture     Final   Value: ESCHERICHIA COLI     Note: SUSCEPTIBILITIES PERFORMED ON PREVIOUS CULTURE WITHIN THE LAST 5 DAYS.     Note: Gram Stain Report Called to,Read Back By and Verified With: SOPHIA PICKETT ON 06/11/2013 AT 8:40P BY WILEJ     Performed at [facility]   Report Status 06/13/2013 FINAL   Final  CULTURE, BLOOD (ROUTINE X 2)     Status: None   Collection Time    06/11/13 12:37 AM      Result Value Ref Range Status   Specimen Description BLOOD LEFT ANTECUBITAL   Final   Special Requests BOTTLES DRAWN AEROBIC AND ANAEROBIC 5CC   Final   Culture  Setup Time     Final   Value: 06/11/2013 03:22     Performed  at [facility]   Culture     Final   Value: ESCHERICHIA COLI     Note: Gram Stain Report Called to,Read Back By and Verified With: SOPHIA PICKETT ON 06/11/2013 AT 8:40P BY WILEJ     Performed at [facility]   Report Status 06/13/2013 FINAL   Final   Organism ID, Bacteria ESCHERICHIA COLI   Final  URINE CULTURE     Status: None   Collection Time    06/11/13  1:59 AM      Result Value Ref Range Status   Specimen Description URINE, CLEAN CATCH   Final   Special Requests NONE   Final   Culture  Setup Time     Final  Value: 06/11/2013 08:15     Performed at [place]     Final   Value: >=100,000 COLONIES/ML     Performed at [facility]   Culture     Final   Value: ESCHERICHIA COLI     Performed at [facility]   Report Status 06/13/2013 FINAL   Final   Organism ID, Bacteria ESCHERICHIA COLI   Final  CULTURE, BLOOD (ROUTINE X 2)     Status: None   Collection Time    06/14/13  1:40 AM      Result Value Ref Range Status   Specimen Description BLOOD LEFT ARM   Final   Special Requests BOTTLES DRAWN AEROBIC AND ANAEROBIC 5CC   Final   Culture  Setup Time     Final   Value: 06/14/2013 10:45     Performed at [facility]   Culture     Final   Value:        BLOOD CULTURE RECEIVED NO GROWTH TO DATE CULTURE WILL BE HELD FOR 5 DAYS BEFORE ISSUING A FINAL NEGATIVE REPORT     Performed at [facility]   Report Status PENDING   Incomplete  CULTURE, BLOOD (ROUTINE X 2)     Status: None   Collection Time    06/14/13  1:45 AM      Result Value Ref Range Status   Specimen Description BLOOD LEFT HAND   Final   Special Requests BOTTLES DRAWN AEROBIC AND ANAEROBIC 3CC   Final   Culture  Setup Time     Final   Value: 06/14/2013 10:45     Performed at [facility]   Culture     Final   Value:        BLOOD CULTURE RECEIVED NO GROWTH TO DATE CULTURE WILL BE HELD FOR 5 DAYS BEFORE ISSUING A FINAL NEGATIVE REPORT      Performed at [facility]   Report Status PENDING   Incomplete     Labs: Basic Metabolic Panel:  Recent Labs Lab 06/11/13 0020 06/11/13 0630 06/12/13 0405 06/13/13 0412 06/14/13 0140 06/15/13 0515  NA 141 136* 138 142 141 141  K 4.6 4.5 4.5 4.1 4.6 4.3  CL 100 97 100 106 106 106  CO2 23 20 21 22 23 22   GLUCOSE 157* 150* 117* 120* 108* 101*  BUN 43* 45* 59* 63* 57* 46*  CREATININE 2.83* 2.82* 3.38* 3.19* 2.88* 2.40*  CALCIUM 10.3 9.5 8.8 8.7 8.4 8.5  MG  --  1.9  --  2.1  --   --   PHOS  --  5.0*  --  4.3  --   --    Liver Function Tests:  Recent Labs Lab 06/11/13 0020  AST 41*  ALT 16  ALKPHOS 110  BILITOT 1.4*  PROT 7.5  ALBUMIN 3.7    Recent Labs Lab 06/11/13 0020  LIPASE 16   No results found for this basename: AMMONIA,  in the last 168 hours CBC:  Recent Labs Lab 06/11/13 0020  06/12/13 0405 06/13/13 0412 06/14/13 0140 06/15/13 0515 06/16/13 0410  WBC 10.8*  < > 8.8 6.5 5.5 5.6 5.1  NEUTROABS 9.8*  --   --   --   --   --   --   HGB 13.4  < > 12.0* 11.5* 11.1* 11.0* 11.2*  HCT 39.6  < > 36.3* 34.9* 34.0* 32.9* 33.5*  MCV 99.0  < >  100.3* 100.9* 100.9* 100.0 99.4  PLT 78*  < > 62* 65* 71* 87* 98*  < > = values in this interval not displayed. Cardiac Enzymes:  Recent Labs Lab 06/11/13 0020  TROPONINI <0.30   BNP: BNP (last 3 results) No results found for this basename: PROBNP,  in the last 8760 hours CBG: No results found for this basename: GLUCAP,  in the last 168 hours     Signed:  Marzetta Board  [facility] 06/16/2013, 4:25 PM

## 2013-06-16 NOTE — Progress Notes (Signed)
[facility]  [facility] is providing the following services: RW  If patient discharges after hours, please call [phone].   Linward Headland 06/16/2013, 1:21 PM

## 2013-06-17 NOTE — Telephone Encounter (Signed)
Explained that our office will be in touch to schedule 4 week post hosp office visit. Pt's wife greeable to plan.

## 2013-06-20 LAB — CULTURE, BLOOD (ROUTINE X 2)
CULTURE: NO GROWTH
Culture: NO GROWTH

## 2013-07-10 ENCOUNTER — Encounter: Payer: Self-pay | Admitting: *Deleted

## 2013-07-15 ENCOUNTER — Encounter: Payer: Self-pay | Admitting: Internal Medicine

## 2013-07-15 ENCOUNTER — Ambulatory Visit (INDEPENDENT_AMBULATORY_CARE_PROVIDER_SITE_OTHER): Payer: Medicare Other | Admitting: Internal Medicine

## 2013-07-15 VITALS — BP 139/79 | HR 76 | Ht 74.0 in | Wt 169.0 lb

## 2013-07-15 DIAGNOSIS — I4949 Other premature depolarization: Secondary | ICD-10-CM

## 2013-07-15 DIAGNOSIS — I4891 Unspecified atrial fibrillation: Secondary | ICD-10-CM

## 2013-07-15 DIAGNOSIS — I493 Ventricular premature depolarization: Secondary | ICD-10-CM

## 2013-07-15 NOTE — Patient Instructions (Signed)
Your physician recommends that you continue on your current medications as directed. Please refer to the Current Medication list given to you today.  No follow up is needed at this time with Dr. Caryl Comes.

## 2013-07-15 NOTE — Progress Notes (Signed)
      Patient Care Team: Jerlyn Ly, MD as PCP - General (Internal Medicine)   HPI  Derrick [name] is a 80 y.o. male Seen in followup for atrial fibrillation identified in the hospital in the context of urosepsis; he was unaware of the atrial fibrillation   His wife is taking his pulse over the months. She has not noted any irregularity. He is back to his baseline.  Echocardiogram 2/15 demonstrated left atrial enlargement and normal left ventricular function  Past Medical History  Diagnosis Date  . Emphysema lung     Past Surgical History  Procedure Laterality Date  . Prostatectomy  2001    Dr Risa Grill  . Abdominal aortic aneurysm repair  2001  . Colectomy  2005    diverticular rupture  . Colon reastomosis  2005    Current Outpatient Prescriptions  Medication Sig Dispense Refill  . aspirin 81 MG tablet Take 81 mg by mouth daily.      . ciprofloxacin (CIPRO) 500 MG tablet Take 1 tablet (500 mg total) by mouth daily with breakfast.  8 tablet  0  . Multiple Vitamin (MULTIVITAMIN) tablet Take 1 tablet by mouth daily.      Marland Kitchen omeprazole (PRILOSEC OTC) 20 MG tablet Take 20 mg by mouth daily.      . vitamin B-12 (CYANOCOBALAMIN) 1000 MCG tablet Take 1,000 mcg by mouth daily.       No current facility-administered medications for this visit.    No Known Allergies  Review of Systems negative except from HPI and PMH  Physical Exam BP 139/79  Pulse 76  Ht 6\' 2"  (1.88 m)  Wt 169 lb (76.658 kg)  BMI 21.69 kg/m2 Well developed and well nourished in no acute distress HENT normal E scleral and icterus clear Neck Supple JVP flat; carotids brisk and full Clear to ausculation  Regular rate and rhythm, no murmurs gallops or rub Soft with active bowel sounds No clubbing cyanosis  Edema Alert and oriented, grossly normal motor and sensory function Skin Warm and Dry \ ECG demonstrates sinus rhythm at 76 intervals 19/11/39 LVH with hypertrophy   Assessment and   Plan  Atrial fibrillation  The patient's atrial fibrillation occurred in the context of urosepsis. It is not clear whether he will recur or not. His wife will continue to take his pulse on a regular basis. At this point we will be glad to see him as needed. Otherwise he will followup with Dr. Joylene Draft.

## 2013-08-04 ENCOUNTER — Encounter: Payer: Self-pay | Admitting: Internal Medicine

## 2014-03-27 ENCOUNTER — Other Ambulatory Visit: Payer: Self-pay | Admitting: Dermatology

## 2014-10-12 ENCOUNTER — Other Ambulatory Visit: Payer: Self-pay

## 2014-11-19 ENCOUNTER — Encounter: Payer: Self-pay | Admitting: Gastroenterology

## 2014-11-19 ENCOUNTER — Encounter: Payer: Self-pay | Admitting: Internal Medicine

## 2015-01-15 ENCOUNTER — Other Ambulatory Visit (HOSPITAL_COMMUNITY): Payer: Self-pay | Admitting: Urology

## 2015-01-15 DIAGNOSIS — C61 Malignant neoplasm of prostate: Secondary | ICD-10-CM

## 2015-01-21 ENCOUNTER — Other Ambulatory Visit (HOSPITAL_COMMUNITY): Payer: Self-pay | Admitting: Urology

## 2015-01-21 DIAGNOSIS — R918 Other nonspecific abnormal finding of lung field: Secondary | ICD-10-CM

## 2015-01-27 ENCOUNTER — Ambulatory Visit (HOSPITAL_COMMUNITY)
Admission: RE | Admit: 2015-01-27 | Discharge: 2015-01-27 | Disposition: A | Discharge status: Home / Self Care | Payer: Medicare Other | Source: Ambulatory Visit | Attending: Urology | Admitting: Urology

## 2015-01-27 DIAGNOSIS — R918 Other nonspecific abnormal finding of lung field: Secondary | ICD-10-CM | POA: Insufficient documentation

## 2015-01-27 LAB — GLUCOSE, CAPILLARY: Glucose-Capillary: 88 mg/dL (ref 65–99)

## 2015-01-27 MED ORDER — FLUDEOXYGLUCOSE F - 18 (FDG) INJECTION
8.5000 | Freq: Once | INTRAVENOUS | Status: DC | PRN
Start: 2015-01-27 — End: 2015-02-02
  Administered 2015-01-27: 8.5 via INTRAVENOUS
  Filled 2015-01-27: qty 8.5

## 2015-02-04 ENCOUNTER — Telehealth: Payer: Self-pay | Admitting: *Deleted

## 2015-02-04 ENCOUNTER — Encounter: Payer: Self-pay | Admitting: *Deleted

## 2015-02-04 NOTE — Telephone Encounter (Signed)
Oncology Nurse Navigator Documentation  Oncology Nurse Navigator Flowsheets 02/04/2015  Navigator Encounter Type Telephone/recieved referral on Mr. Doby.  I called to schedule.  I spoke with patient's wife. She is aware of appt time and place.  She and patient are not aware they would like tx but will come to consultation.    Patient Visit Type Initial  Treatment Phase Abnormal Scans  Interventions Coordination of Care  Coordination of Care MD Appointments  Time Spent with Patient 15

## 2015-02-11 ENCOUNTER — Ambulatory Visit
Admission: RE | Admit: 2015-02-11 | Discharge: 2015-02-11 | Disposition: A | Discharge status: Home / Self Care | Payer: Medicare Other | Source: Ambulatory Visit | Attending: Radiation Oncology | Admitting: Radiation Oncology

## 2015-02-11 VITALS — BP 147/88 | HR 87 | Temp 98.3°F | Resp 17

## 2015-02-11 DIAGNOSIS — Z8546 Personal history of malignant neoplasm of prostate: Secondary | ICD-10-CM

## 2015-02-11 DIAGNOSIS — C3431 Malignant neoplasm of lower lobe, right bronchus or lung: Secondary | ICD-10-CM

## 2015-02-11 NOTE — Progress Notes (Signed)
Radiation Oncology         ([phone]) [phone] ________________________________  Initial outpatient Consultation  Name: Derrick [name] MRN: [id number]  Date: 02/11/2015  DOB: [birth year]  DG:[id number],VQQV A, MD  [name], MD   REFERRING PHYSICIAN: [name], MD  DIAGNOSIS: 81 yo gentleman with a PET positive right lower lung mass and right hilar adenopathy hot on PET and not biopsied.    ICD-9-CM ICD-10-CM   1. History of prostate cancer V10.46 Z85.46     HISTORY OF PRESENT ILLNESS::Derrick [name] Risk is a 81 y.o. male who complained of hematuria. [name] ordered a CT scan which showed among other findings, some indistinct nodularity in the right lung base. PET scan on 01/27/15 showed hypermetabolic nodular areas of consolidation in the right lower lobe, worrisome for primary bronchogenic carcinoma. An infectious process is not entirely excluded. If there is intervening imaging from 06/11/2013 for comparison, it would be helpful. He was also noted to have hypermetabolic unenlarged bi hilar and low right paratracheal lymph nodes.  His PET suggests possible malignancy versus infectious process, so he was referred for evaluation in the multidisciplinary thoracic oncology clinic.    PREVIOUS RADIATION THERAPY: No  PAST MEDICAL HISTORY:  has a past medical history of Emphysema lung ([place]); Atrial fibrillation ([place]); Urosepsis; and Prostate cancer ([place]).  The patient has a history of prostate cancer in 2001. He had a prostatectomy performed by [name] and the patient had no radiation.   PAST SURGICAL HISTORY: Past Surgical History  Procedure Laterality Date  . Prostatectomy  2001    Dr Risa Grill  . Abdominal aortic aneurysm repair  2001  . Colectomy  2005    diverticular rupture  . Colon reastomosis  2005    FAMILY HISTORY: family history includes Alzheimer's disease in his sister; Heart disease in his mother; Parkinson's disease in his brother; Stroke in his father.  SOCIAL  HISTORY:  Social History   Social History  . Marital Status: Married    Spouse Name: N/A  . Number of Children: N/A  . Years of Education: N/A   Occupational History  . Not on file.   Social History Main Topics  . Smoking status: Former Smoker -- 1.00 packs/day    Quit date: 04/18/1999  . Smokeless tobacco: Never Used  . Alcohol Use: Yes     Comment: Beer-seldom   . Drug Use: No  . Sexual Activity: No   Other Topics Concern  . Not on file   Social History Narrative    ALLERGIES: Review of patient's allergies indicates no known allergies.  MEDICATIONS:  Current Outpatient Prescriptions  Medication Sig Dispense Refill  . aspirin 81 MG tablet Take 81 mg by mouth daily.    . ciprofloxacin (CIPRO) 500 MG tablet Take 1 tablet (500 mg total) by mouth daily with breakfast. 8 tablet 0  . Multiple Vitamin (MULTIVITAMIN) tablet Take 1 tablet by mouth daily.    Marland Kitchen omeprazole (PRILOSEC OTC) 20 MG tablet Take 20 mg by mouth daily.    . vitamin B-12 (CYANOCOBALAMIN) 1000 MCG tablet Take 1,000 mcg by mouth daily.     No current facility-administered medications for this encounter.    REVIEW OF SYSTEMS:  A 15 point review of systems is documented in the electronic medical record. This was obtained by the nursing staff. However, I reviewed this with the patient to discuss relevant findings and make appropriate changes.  Pertinent items are noted in HPI.   PHYSICAL EXAM:  temperature is  98.3 F (36.8 C). His blood pressure is 147/88 and his pulse is 87. His respiration is 17 and oxygen saturation is 94%.   KPS = 100  100 - Normal; no complaints; no evidence of disease. 90   - Able to carry on normal activity; minor signs or symptoms of disease. 80   - Normal activity with effort; some signs or symptoms of disease. 31   - Cares for self; unable to carry on normal activity or to do active work. 60   - Requires occasional assistance, but is able to care for most of his personal  needs. 50   - Requires considerable assistance and frequent medical care. 63   - Disabled; requires special care and assistance. 46   - Severely disabled; hospital admission is indicated although death not imminent. 1   - Very sick; hospital admission necessary; active supportive treatment necessary. 10   - Moribund; fatal processes progressing rapidly. 0     - Dead  Karnofsky DA, Abelmann [place], Craver LS and Burchenal [facility] [phone]) The use of the nitrogen mustards in the palliative treatment of carcinoma: with particular reference to bronchogenic carcinoma Cancer 1 [id number]  LABORATORY DATA:  Lab Results  Component Value Date   WBC 5.1 06/16/2013   HGB 11.2* 06/16/2013   HCT 33.5* 06/16/2013   MCV 99.4 06/16/2013   PLT 98* 06/16/2013   Lab Results  Component Value Date   NA 141 06/15/2013   K 4.3 06/15/2013   CL 106 06/15/2013   CO2 22 06/15/2013   Lab Results  Component Value Date   ALT 16 06/11/2013   AST 41* 06/11/2013   ALKPHOS 110 06/11/2013   BILITOT 1.4* 06/11/2013     RADIOGRAPHY: Nm Pet Image Initial (pi) Skull Base To Thigh  01/27/2015  CLINICAL DATA:  Initial treatment strategy for lung mass. EXAM: NUCLEAR MEDICINE PET SKULL BASE TO THIGH TECHNIQUE: 8.5 mCi F-18 FDG was injected intravenously. Full-ring PET imaging was performed from the skull base to thigh after the radiotracer. CT data was obtained and used for attenuation correction and anatomic localization. FASTING BLOOD GLUCOSE:  Value: 88 mg/dl COMPARISON:  CT abdomen pelvis 06/11/2013. FINDINGS: NECK No hypermetabolic lymph nodes in the neck. CT images show no acute findings. CHEST Mediastinal lymph nodes are not enlarged by CT size criteria, measuring up to 8 mm in the lower right paratracheal station with an SUV max of 5.9. Abnormal hypermetabolism is seen in the hilar regions bilaterally, right greater than left, with index SUV max on the right, 9.4 and on the left, 7.5. Discrete lymph nodes are difficult to  measure without IV contrast. Irregular areas of consolidation in the right lower lobe (CT images 61 and 64) are hypermetabolic. SUV max of 6.6 corresponds to the portion of the lesion seen superior medially (image 61). Comparison with 06/11/2013 is somewhat limited due to respiratory motion this area but there did appear to be minimal soft tissue or mucoid impaction in the same location on that study. CT images show three-vessel coronary artery calcification. Ascending aorta measures approximately 4.2 cm. No pericardial or pleural effusion. ABDOMEN/PELVIS No abnormal hypermetabolism in the liver, adrenal glands, spleen or pancreas. No hypermetabolic lymph nodes. CT images show the liver to be grossly unremarkable. Cholecystectomy. Adrenal glands are unremarkable. Multiple low-attenuation lesions are seen in the kidneys bilaterally, measuring up to 10.1 cm on the right, difficult to further characterize without post-contrast imaging or due to size. Spleen is grossly unremarkable. A 2.1 cm low-attenuation  lesion off the uncinate process of the pancreas is unchanged from 06/11/2013 but is slightly larger than on 08/03/2004, at which time it measured 9 mm. Small to moderate hiatal hernia. Atherosclerotic calcification of the arterial vasculature. Infrarenal aorta measures up to 2.9 cm. There are large common femoral aneurysms bilaterally, measuring 5.0 cm on the right and 4.2 cm on the left. On 06/11/2013, these measured 4.1 cm and 3.4 cm, respectively, and are new from 08/03/2004. SKELETON No abnormal osseous hypermetabolism. Degenerative changes are seen in the spine. IMPRESSION: 1. Hypermetabolic nodular areas of consolidation in the right lower lobe, worrisome for primary bronchogenic carcinoma. An infectious process is not entirely excluded. If there is intervening imaging from 06/11/2013 for comparison, it would be helpful. 2. Mildly hypermetabolic unenlarged bi hilar and low right paratracheal lymph nodes. 3.  Large bilateral common femoral artery aneurysms, progressive from prior exams. These results will be called to the ordering clinician or representative by the Radiologist Assistant, and communication documented in the PACS or zVision Dashboard. 4. Ascending aortic aneurysm. Recommend annual imaging followup by CTA or MRA. This recommendation follows 2010 ACCF/AHA/AATS/ACR/ASA/SCA/SCAI/SIR/STS/SVM Guidelines for the Diagnosis and Management of Patients with Thoracic Aortic Disease. Circulation. 2010; 121: H885-O277. 5. Borderline infrarenal aortic aneurysm. 6. Low-attenuation lesion off the uncinate process of the pancreas, stable from 06/11/2013 but larger than on 08/03/2004. Indolent cystic pancreatic neoplasm cannot be definitively excluded. If further evaluation is desired, baseline MR abdomen without and with contrast could be performed now or in 12 months. Electronically Signed   By: Lorin Picket M.D.   On: 01/27/2015 16:37      IMPRESSION: 81 yo gentleman with asymptomatic hypermetabolic nodules in the right lower lung and mildly hypermetabolic mediastinal lymph nodes.  He has not had any attempt at tissue diagnosis such as CT biopsy or bronchoscopy.  PLAN: Today, I talked to the patient and family about the findings and work-up thus far.  He understands that it is possible he has lung cancer or a pulmonary infection.  He has not expressed interest in pursuing a biopsy because, if it is lung cancer, he is not sure if he wants treatment.  So, I talked to him a little today about radiation therapy.    I advised the patient to speak with a pulmonologist to weight the benefits and repercussions of a lung biopsy. At this time, he has expressed that he is 81 years old and wishes to let nature take its course.  However, he has agreed to keep thinking about treatment.   I spent 60 minutes minutes face to face with the patient and more than 50% of that time was spent in counseling and/or coordination of  care.   This document serves as a record of services personally performed by Tyler Pita, MD. It was created on his behalf by Darcus Austin, a trained medical scribe. The creation of this record is based on the scribe's personal observations and the provider's statements to them. This document has been checked and approved by the attending provider.    ------------------------------------------------  Sheral Apley Tammi Klippel, M.D.

## 2015-02-12 ENCOUNTER — Encounter: Payer: Self-pay | Admitting: *Deleted

## 2015-02-12 NOTE — Progress Notes (Signed)
Oncology Nurse Navigator Documentation  Oncology Nurse Navigator Flowsheets 02/12/2015  Navigator Encounter Type Clinic/MDC/spoke with patient and wife at thoracic clinic yesterday.  They spoke with Dr. Tammi Klippel and would like time to think about tx and tissue dx.   Patient Visit Type Initial  Treatment Phase Abnormal Scans  Interventions Other  Coordination of Care -  Time Spent with Patient 15

## 2015-02-18 ENCOUNTER — Telehealth: Payer: Self-pay | Admitting: *Deleted

## 2015-02-18 NOTE — Telephone Encounter (Signed)
Oncology Nurse Navigator Documentation  Oncology Nurse Navigator Flowsheets 02/18/2015  Navigator Encounter Type Telephone/I called patient to follow up today after clinic.  I asked him what his thoughts were on tx.  I listened as he explained.  He said he would like a follow up with Dr. Tammi Klippel  Patient Visit Type Follow-up  Treatment Phase Abnormal Scans  Interventions Coordination of Care  Coordination of Care -  Time Spent with Patient 30

## 2015-02-23 ENCOUNTER — Telehealth: Payer: Self-pay | Admitting: *Deleted

## 2015-02-23 NOTE — Telephone Encounter (Signed)
Oncology Nurse Navigator Documentation  Oncology Nurse Navigator Flowsheets 02/23/2015  Navigator Encounter Type Telephone/called patient to set up to see Dr. Julien Nordmann.  I was unable to reach nor leave vm message.   Patient Visit Type Follow-up  Treatment Phase Abnormal Scans  Interventions Coordination of Care  Coordination of Care MD Appointments  Time Spent with Patient 15

## 2015-02-26 ENCOUNTER — Telehealth: Payer: Self-pay | Admitting: *Deleted

## 2015-02-26 NOTE — Telephone Encounter (Signed)
Oncology Nurse Navigator Documentation  Oncology Nurse Navigator Flowsheets 02/26/2015  Navigator Encounter Type Telephone/I called Derrick [name] today to follow up regarding tx.  I listened as he explained.  He does not want to get radiation nor talk with Dr. Julien Nordmann.  I stated we are here to help if he changes his mind. He was thankful for the call.   Patient Visit Type Follow-up  Treatment Phase Abnormal Scans  Interventions Coordination of Care  Coordination of Care MD Appointments  Time Spent with Patient 15

## 2015-07-21 ENCOUNTER — Other Ambulatory Visit (HOSPITAL_COMMUNITY): Payer: Self-pay | Admitting: *Deleted

## 2015-07-22 ENCOUNTER — Ambulatory Visit (HOSPITAL_COMMUNITY)
Admission: RE | Admit: 2015-07-22 | Discharge: 2015-07-22 | Disposition: A | Discharge status: Home / Self Care | Payer: Medicare Other | Source: Ambulatory Visit | Attending: Internal Medicine | Admitting: Internal Medicine

## 2015-07-22 DIAGNOSIS — M810 Age-related osteoporosis without current pathological fracture: Secondary | ICD-10-CM | POA: Insufficient documentation

## 2015-07-22 MED ORDER — DENOSUMAB 60 MG/ML ~~LOC~~ SOLN
60.0000 mg | Freq: Once | SUBCUTANEOUS | Status: AC
  Administered 2015-07-22: 60 mg via SUBCUTANEOUS
  Filled 2015-07-22: qty 1

## 2015-12-22 IMAGING — CR DG CHEST 2V
2 series · 2 of 2 positions shown · non-contrast
Comparison: 11/13/2011

CLINICAL DATA: 85-year-old male with hypoxia.

EXAM:
CHEST  2 VIEW

[w chest lat]
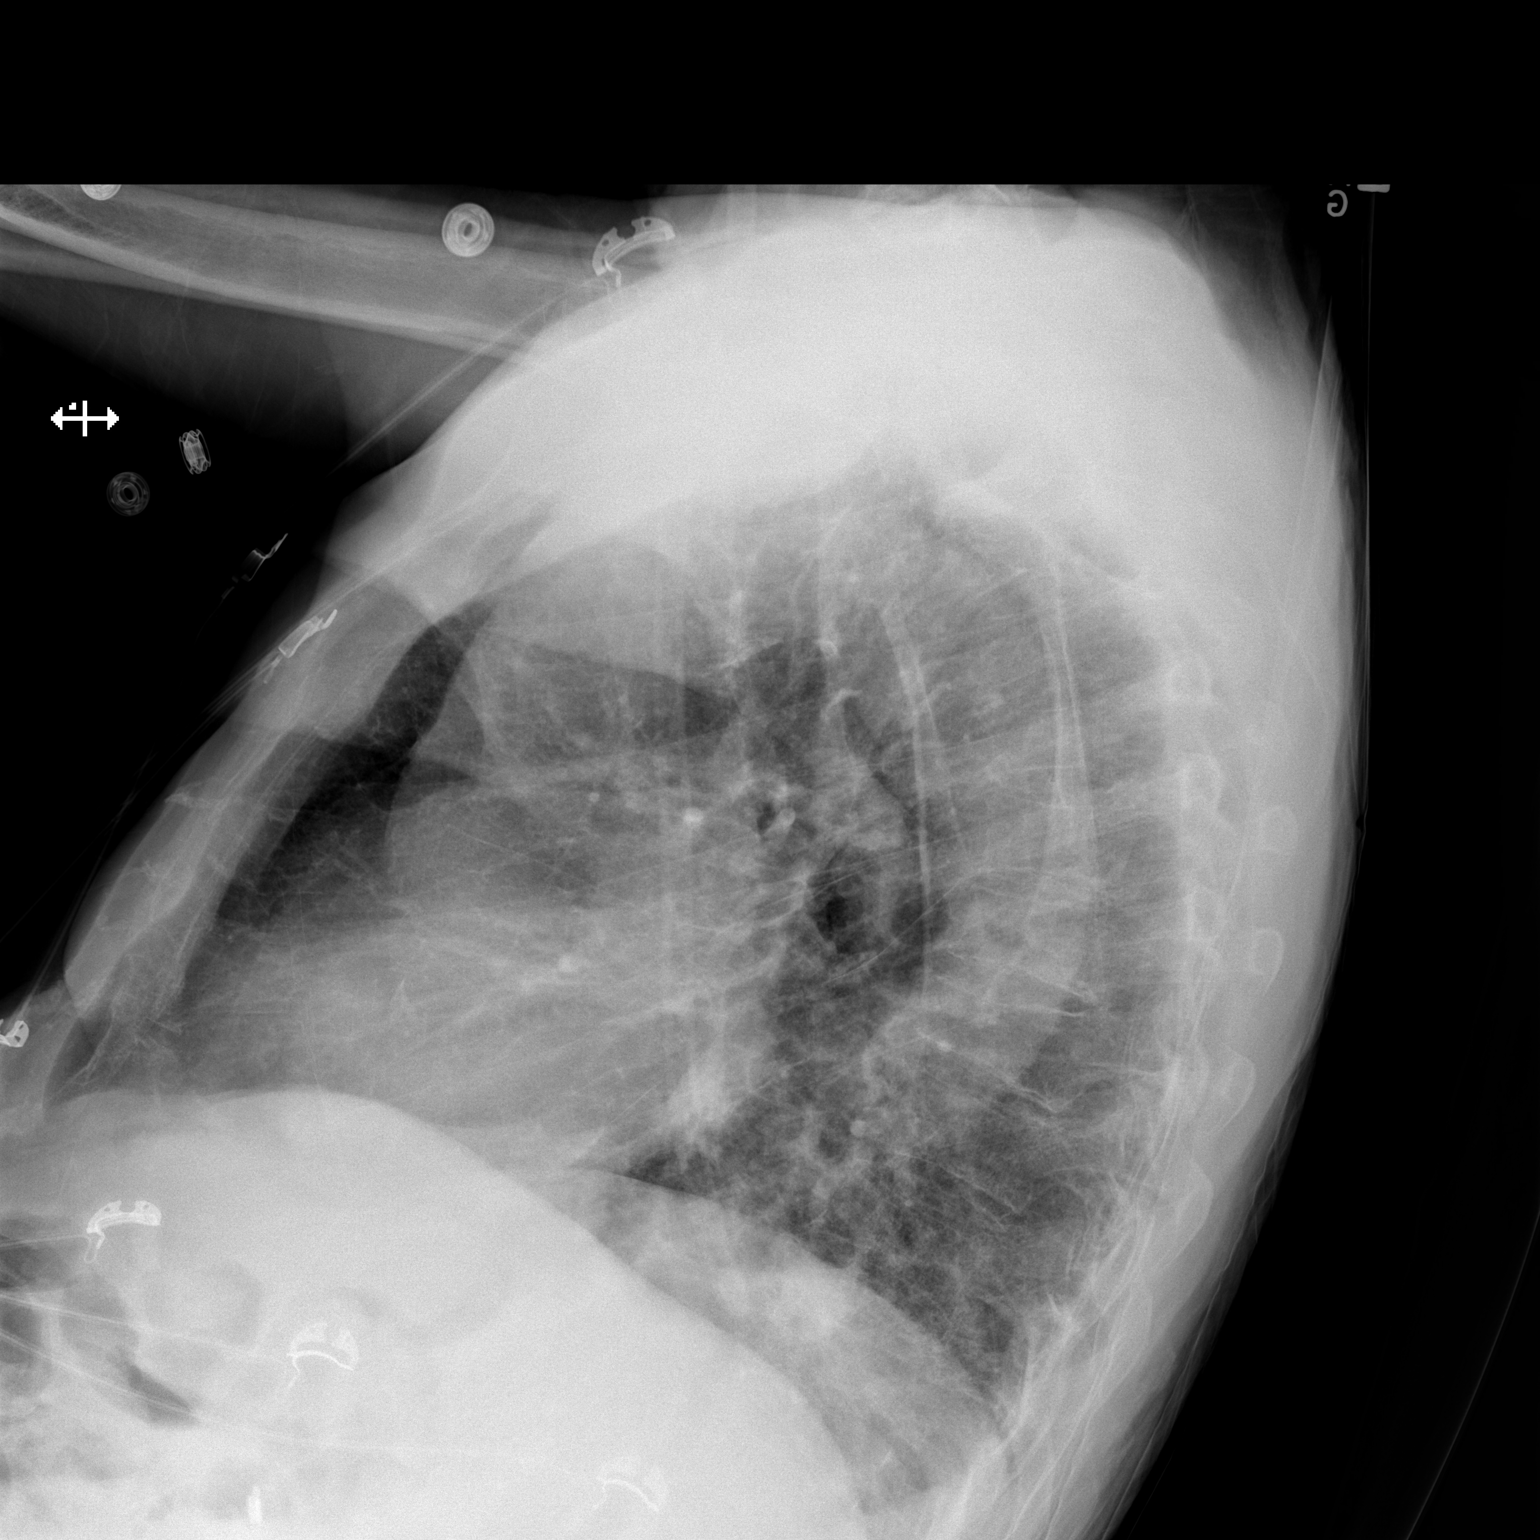

[x chest ap]
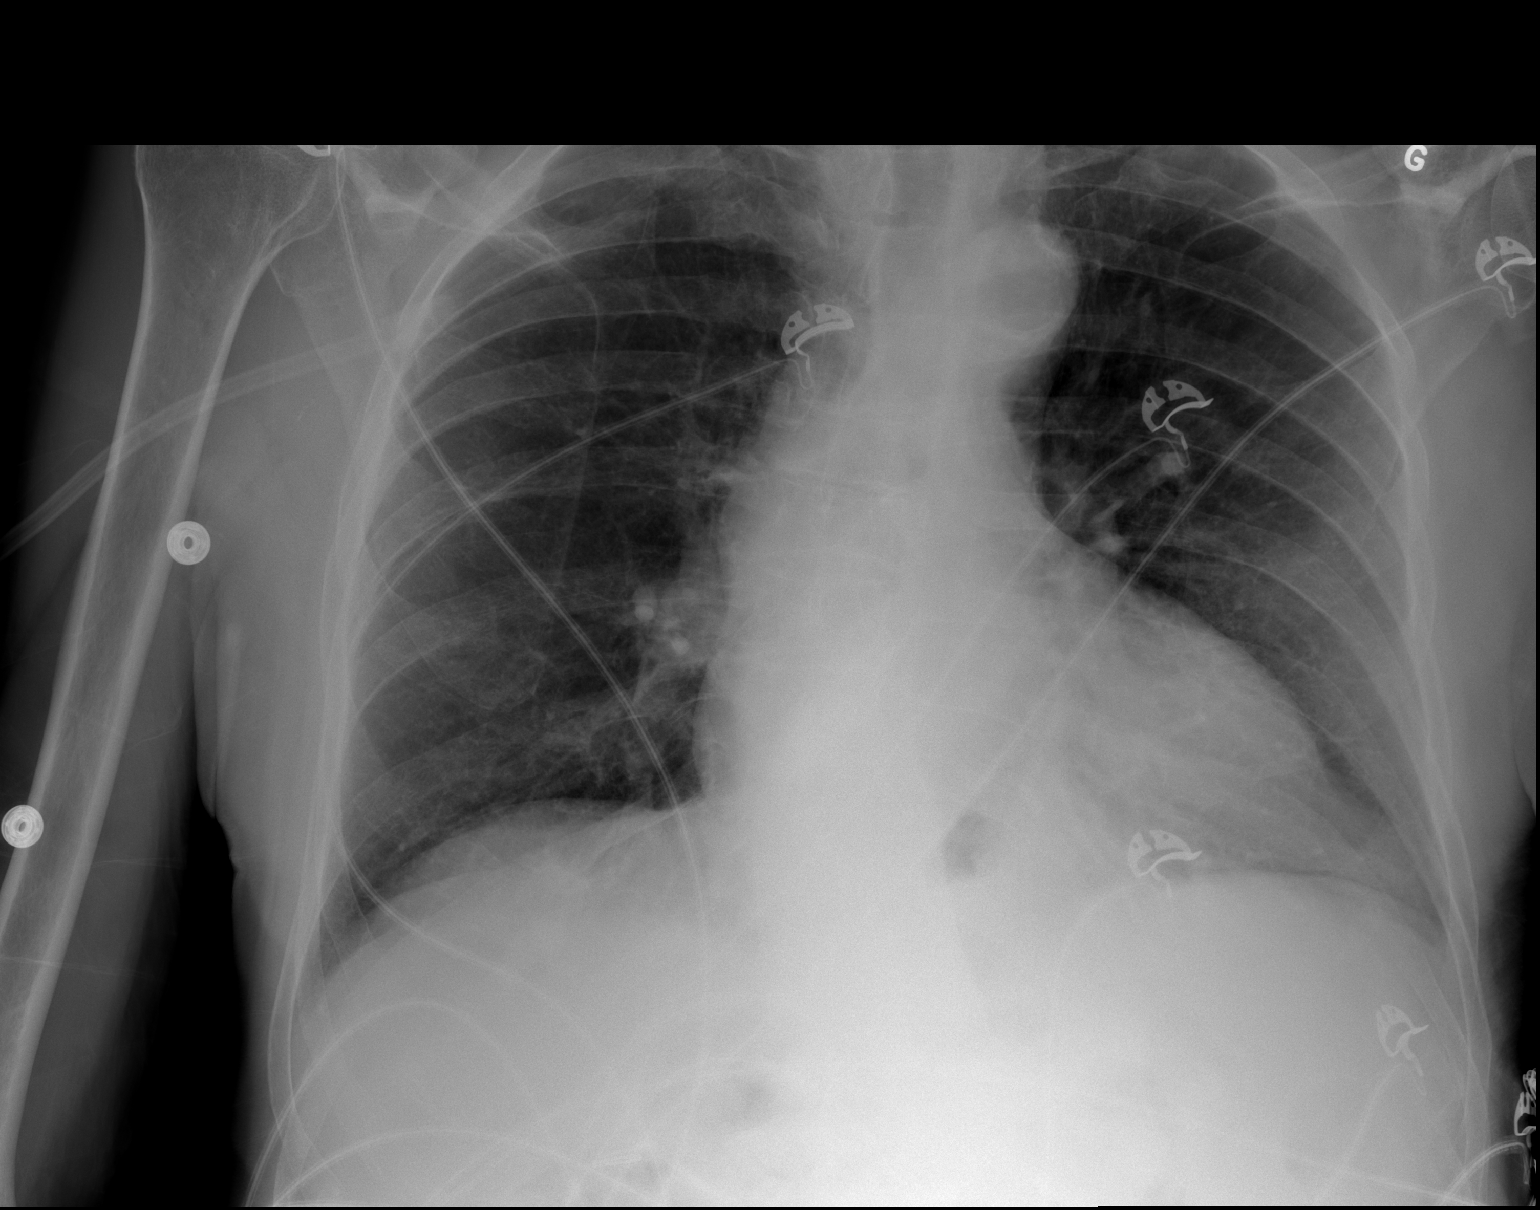

[2 of 2 positions shown; findings below may reference images not displayed]

FINDINGS: Cardiomegaly noted.

A moderate hiatal hernia again identified.

There is no evidence of focal airspace disease, pulmonary edema,
suspicious pulmonary nodule/mass, pleural effusion, or pneumothorax.
No acute bony abnormalities are identified.

Mid thoracic compression fractures are unchanged.
IMPRESSION: Cardiomegaly without evidence of acute cardiopulmonary disease.

Moderate hiatal hernia.

## 2016-01-06 ENCOUNTER — Other Ambulatory Visit: Payer: Self-pay | Admitting: Internal Medicine

## 2016-01-06 DIAGNOSIS — R911 Solitary pulmonary nodule: Secondary | ICD-10-CM

## 2016-01-10 ENCOUNTER — Ambulatory Visit
Admission: RE | Admit: 2016-01-10 | Discharge: 2016-01-10 | Disposition: A | Discharge status: Home / Self Care | Payer: Medicare Other | Source: Ambulatory Visit | Attending: Internal Medicine | Admitting: Internal Medicine

## 2016-01-10 DIAGNOSIS — R911 Solitary pulmonary nodule: Secondary | ICD-10-CM

## 2016-11-08 ENCOUNTER — Emergency Department (HOSPITAL_COMMUNITY): Payer: Medicare Other

## 2016-11-08 ENCOUNTER — Encounter (HOSPITAL_COMMUNITY): Payer: Self-pay | Admitting: Emergency Medicine

## 2016-11-08 ENCOUNTER — Observation Stay (HOSPITAL_BASED_OUTPATIENT_CLINIC_OR_DEPARTMENT_OTHER): Payer: Medicare Other

## 2016-11-08 ENCOUNTER — Observation Stay (HOSPITAL_COMMUNITY)
Admission: EM | Admit: 2016-11-08 | Discharge: 2016-11-09 | Disposition: A | Discharge status: Home / Self Care | Payer: Medicare Other | Attending: Family Medicine | Admitting: Family Medicine

## 2016-11-08 DIAGNOSIS — J441 Chronic obstructive pulmonary disease with (acute) exacerbation: Secondary | ICD-10-CM | POA: Diagnosis not present

## 2016-11-08 DIAGNOSIS — Z79899 Other long term (current) drug therapy: Secondary | ICD-10-CM | POA: Diagnosis not present

## 2016-11-08 DIAGNOSIS — R918 Other nonspecific abnormal finding of lung field: Secondary | ICD-10-CM | POA: Insufficient documentation

## 2016-11-08 DIAGNOSIS — Z7982 Long term (current) use of aspirin: Secondary | ICD-10-CM | POA: Diagnosis not present

## 2016-11-08 DIAGNOSIS — D61818 Other pancytopenia: Secondary | ICD-10-CM | POA: Insufficient documentation

## 2016-11-08 DIAGNOSIS — Z87891 Personal history of nicotine dependence: Secondary | ICD-10-CM | POA: Insufficient documentation

## 2016-11-08 DIAGNOSIS — I5189 Other ill-defined heart diseases: Secondary | ICD-10-CM | POA: Diagnosis present

## 2016-11-08 DIAGNOSIS — H9190 Unspecified hearing loss, unspecified ear: Secondary | ICD-10-CM | POA: Diagnosis not present

## 2016-11-08 DIAGNOSIS — J9601 Acute respiratory failure with hypoxia: Secondary | ICD-10-CM | POA: Diagnosis not present

## 2016-11-08 DIAGNOSIS — I519 Heart disease, unspecified: Secondary | ICD-10-CM | POA: Insufficient documentation

## 2016-11-08 DIAGNOSIS — Z9079 Acquired absence of other genital organ(s): Secondary | ICD-10-CM | POA: Insufficient documentation

## 2016-11-08 DIAGNOSIS — E440 Moderate protein-calorie malnutrition: Secondary | ICD-10-CM

## 2016-11-08 DIAGNOSIS — D693 Immune thrombocytopenic purpura: Secondary | ICD-10-CM | POA: Diagnosis not present

## 2016-11-08 DIAGNOSIS — I351 Nonrheumatic aortic (valve) insufficiency: Secondary | ICD-10-CM

## 2016-11-08 DIAGNOSIS — J449 Chronic obstructive pulmonary disease, unspecified: Secondary | ICD-10-CM | POA: Diagnosis present

## 2016-11-08 DIAGNOSIS — D696 Thrombocytopenia, unspecified: Secondary | ICD-10-CM | POA: Diagnosis not present

## 2016-11-08 DIAGNOSIS — E46 Unspecified protein-calorie malnutrition: Secondary | ICD-10-CM | POA: Diagnosis present

## 2016-11-08 DIAGNOSIS — Z681 Body mass index (BMI) 19 or less, adult: Secondary | ICD-10-CM | POA: Diagnosis not present

## 2016-11-08 DIAGNOSIS — J439 Emphysema, unspecified: Secondary | ICD-10-CM | POA: Insufficient documentation

## 2016-11-08 DIAGNOSIS — N184 Chronic kidney disease, stage 4 (severe): Secondary | ICD-10-CM | POA: Diagnosis not present

## 2016-11-08 DIAGNOSIS — I4891 Unspecified atrial fibrillation: Secondary | ICD-10-CM | POA: Diagnosis not present

## 2016-11-08 DIAGNOSIS — Z8546 Personal history of malignant neoplasm of prostate: Secondary | ICD-10-CM | POA: Diagnosis not present

## 2016-11-08 DIAGNOSIS — E43 Unspecified severe protein-calorie malnutrition: Secondary | ICD-10-CM | POA: Diagnosis not present

## 2016-11-08 LAB — I-STAT TROPONIN, ED: TROPONIN I, POC: 0.05 ng/mL (ref 0.00–0.08)

## 2016-11-08 LAB — DIFFERENTIAL
Basophils Absolute: 0.1 10*3/uL (ref 0.0–0.1)
Basophils Relative: 2 %
EOS ABS: 0.1 10*3/uL (ref 0.0–0.7)
EOS PCT: 2 %
Lymphocytes Relative: 15 %
Lymphs Abs: 0.5 10*3/uL — ABNORMAL LOW (ref 0.7–4.0)
MONO ABS: 0.5 10*3/uL (ref 0.1–1.0)
Monocytes Relative: 15 %
NEUTROS PCT: 66 %
Neutro Abs: 2.2 10*3/uL (ref 1.7–7.7)

## 2016-11-08 LAB — ECHOCARDIOGRAM COMPLETE

## 2016-11-08 LAB — BASIC METABOLIC PANEL
Anion gap: 10 (ref 5–15)
BUN: 51 mg/dL — AB (ref 6–20)
CALCIUM: 10.1 mg/dL (ref 8.9–10.3)
CO2: 24 mmol/L (ref 22–32)
CREATININE: 3.82 mg/dL — AB (ref 0.61–1.24)
Chloride: 105 mmol/L (ref 101–111)
GFR, EST AFRICAN AMERICAN: 15 mL/min — AB (ref >60)
GFR, EST NON AFRICAN AMERICAN: 13 mL/min — AB (ref >60)
Glucose, Bld: 124 mg/dL — ABNORMAL HIGH (ref 65–99)
Potassium: 5.9 mmol/L — ABNORMAL HIGH (ref 3.5–5.1)
SODIUM: 139 mmol/L (ref 135–145)

## 2016-11-08 LAB — CBC
HCT: 35 % — ABNORMAL LOW (ref 39.0–52.0)
Hemoglobin: 11.4 g/dL — ABNORMAL LOW (ref 13.0–17.0)
MCH: 31.7 pg (ref 26.0–34.0)
MCHC: 32.6 g/dL (ref 30.0–36.0)
MCV: 97.2 fL (ref 78.0–100.0)
PLATELETS: 107 10*3/uL — AB (ref 150–400)
RBC: 3.6 MIL/uL — AB (ref 4.22–5.81)
RDW: 12.7 % (ref 11.5–15.5)
WBC: 3.3 10*3/uL — AB (ref 4.0–10.5)

## 2016-11-08 LAB — MAGNESIUM: Magnesium: 2 mg/dL (ref 1.7–2.4)

## 2016-11-08 LAB — PHOSPHORUS: Phosphorus: 4.4 mg/dL (ref 2.5–4.6)

## 2016-11-08 LAB — BRAIN NATRIURETIC PEPTIDE: B NATRIURETIC PEPTIDE 5: 242.1 pg/mL — AB (ref 0.0–100.0)

## 2016-11-08 LAB — PROCALCITONIN: PROCALCITONIN: 0.11 ng/mL

## 2016-11-08 MED ORDER — OMEPRAZOLE MAGNESIUM 20 MG PO TBEC: 20.0000 mg | DELAYED_RELEASE_TABLET | Freq: Every day | ORAL | Status: DC

## 2016-11-08 MED ORDER — ADULT MULTIVITAMIN W/MINERALS CH
1.0000 | ORAL_TABLET | Freq: Every day | ORAL | Status: DC
  Administered 2016-11-09: 1 via ORAL
  Filled 2016-11-08: qty 1

## 2016-11-08 MED ORDER — METHYLPREDNISOLONE SODIUM SUCC 125 MG IJ SOLR
60.0000 mg | Freq: Two times a day (BID) | INTRAMUSCULAR | Status: DC
  Administered 2016-11-09 (×2): 60 mg via INTRAVENOUS
  Filled 2016-11-08 (×2): qty 2

## 2016-11-08 MED ORDER — IPRATROPIUM-ALBUTEROL 0.5-2.5 (3) MG/3ML IN SOLN
3.0000 mL | Freq: Three times a day (TID) | RESPIRATORY_TRACT | Status: DC
  Administered 2016-11-09: 3 mL via RESPIRATORY_TRACT
  Filled 2016-11-08: qty 3

## 2016-11-08 MED ORDER — ACETAMINOPHEN 325 MG PO TABS
650.0000 mg | ORAL_TABLET | Freq: Four times a day (QID) | ORAL | Status: DC | PRN
  Administered 2016-11-09: 650 mg via ORAL
  Filled 2016-11-08: qty 2

## 2016-11-08 MED ORDER — IPRATROPIUM-ALBUTEROL 0.5-2.5 (3) MG/3ML IN SOLN
3.0000 mL | Freq: Once | RESPIRATORY_TRACT | Status: AC
  Administered 2016-11-08: 3 mL via RESPIRATORY_TRACT
  Filled 2016-11-08: qty 3

## 2016-11-08 MED ORDER — PANTOPRAZOLE SODIUM 40 MG PO TBEC
40.0000 mg | DELAYED_RELEASE_TABLET | Freq: Every day | ORAL | Status: DC
  Administered 2016-11-09: 40 mg via ORAL
  Filled 2016-11-08: qty 1

## 2016-11-08 MED ORDER — UMECLIDINIUM-VILANTEROL 62.5-25 MCG/INH IN AEPB: 1.0000 | INHALATION_SPRAY | Freq: Every day | RESPIRATORY_TRACT | Status: DC

## 2016-11-08 MED ORDER — ENOXAPARIN SODIUM 30 MG/0.3ML ~~LOC~~ SOLN
30.0000 mg | SUBCUTANEOUS | Status: DC
  Administered 2016-11-08: 30 mg via SUBCUTANEOUS
  Filled 2016-11-08: qty 0.3

## 2016-11-08 MED ORDER — METHYLPREDNISOLONE SODIUM SUCC 125 MG IJ SOLR
125.0000 mg | Freq: Once | INTRAMUSCULAR | Status: AC
  Administered 2016-11-08: 125 mg via INTRAVENOUS
  Filled 2016-11-08: qty 2

## 2016-11-08 MED ORDER — SODIUM CHLORIDE 0.9 % IV SOLN
INTRAVENOUS | Status: DC
  Administered 2016-11-08 – 2016-11-09 (×2): via INTRAVENOUS

## 2016-11-08 MED ORDER — ASPIRIN EC 81 MG PO TBEC
81.0000 mg | DELAYED_RELEASE_TABLET | Freq: Every day | ORAL | Status: DC
  Administered 2016-11-09: 81 mg via ORAL
  Filled 2016-11-08: qty 1

## 2016-11-08 MED ORDER — IPRATROPIUM-ALBUTEROL 0.5-2.5 (3) MG/3ML IN SOLN
3.0000 mL | Freq: Four times a day (QID) | RESPIRATORY_TRACT | Status: DC
  Administered 2016-11-08 (×2): 3 mL via RESPIRATORY_TRACT
  Filled 2016-11-08 (×2): qty 3

## 2016-11-08 MED ORDER — SODIUM CHLORIDE 0.9% FLUSH
3.0000 mL | Freq: Two times a day (BID) | INTRAVENOUS | Status: DC
  Administered 2016-11-08: 3 mL via INTRAVENOUS

## 2016-11-08 MED ORDER — ENSURE ENLIVE PO LIQD
237.0000 mL | Freq: Two times a day (BID) | ORAL | Status: DC
  Administered 2016-11-09 (×2): 237 mL via ORAL

## 2016-11-08 MED ORDER — ACETAMINOPHEN 650 MG RE SUPP
650.0000 mg | Freq: Four times a day (QID) | RECTAL | Status: DC | PRN
Start: 2016-11-08 — End: 2016-11-09

## 2016-11-08 MED ORDER — ALBUTEROL SULFATE (2.5 MG/3ML) 0.083% IN NEBU: 2.5000 mg | INHALATION_SOLUTION | RESPIRATORY_TRACT | Status: DC | PRN

## 2016-11-08 MED ORDER — SODIUM CHLORIDE 0.9 % IV BOLUS (SEPSIS)
500.0000 mL | Freq: Once | INTRAVENOUS | Status: AC
  Administered 2016-11-08: 500 mL via INTRAVENOUS

## 2016-11-08 NOTE — ED Notes (Signed)
Room air sats 87%

## 2016-11-08 NOTE — ED Notes (Signed)
Pt aware of the need for a urine specimen.  Unable to void at this time.

## 2016-11-08 NOTE — ED Triage Notes (Signed)
Pt went to primary MD today for SOB and increasing weakness over the past few days. Sats 81% upon arrival to MD office. EMS reports pt's sats 98% on 5L [place]. Pt does not wear O2 at home. Pt has COPD. EMS gave 324mg  ASA for reported CP over the past few days. EMS also gave 5 of albuterol. Pt in Afib rates 90-120. Hx of this. Bp 140/90, CBG 146

## 2016-11-08 NOTE — ED Provider Notes (Signed)
[facility] DEPT Provider Note   CSN: [id number] Arrival date & time: 11/08/16  1013     History   Chief Complaint Chief Complaint  Patient presents with  . Shortness of Breath    HPI Derrick [name] is a 83 y.o. male.  The history is provided by the patient.  Shortness of Breath  This is a new problem. The problem occurs continuously.The current episode started more than 1 week ago. The problem has been gradually worsening. Associated symptoms include cough and sputum production. Pertinent negatives include no fever, no rhinorrhea, no sore throat, no orthopnea, no chest pain, no leg pain and no leg swelling. It is unknown what precipitated the problem. He has tried nothing for the symptoms. Associated medical issues include COPD and DVT.   83 year old male who presents for shortness of breath. He has a history of emphysema and atrial fibrillation. His wife states that she has noticed that he has become gradually more short of breath over the past month, but his shortness of breath has been worsening over the past few days. He initially went to his PCPs office today was noted to be 81% on room air. Was sent to the emergency department with supplemental oxygen. He denies any chronic home oxygen usage. Has noted mild cough that is productive occasionally of yellow sputum. No fevers, chills, chest pain, lower extremity edema or calf tenderness. He reports also history of DVT, but no longer on anticoagulation.  Records reviewed.  he also has a history of a right lung mass which she has seen oncology for. At that time he refused biopsy given that he is unsure if he would want treatment for it in the first place.   Past Medical History:  Diagnosis Date  . Atrial fibrillation ([place])    In the context of urosepsis  . Emphysema lung ([place])   . Prostate cancer ([place])   . Urosepsis     Patient Active Problem List   Diagnosis Date Noted  . Atrial fibrillation ([place]) 11/08/2016  . Emphysema  lung ([place]) 11/08/2016  . Mass of right lung 11/08/2016  . Acute respiratory failure with hypoxia ([place]) 11/08/2016  . CKD (chronic kidney disease) stage 4, GFR 15-29 ml/min (HCC) 11/08/2016  . Left ventricular diastolic dysfunction, NYHA class 2 11/08/2016  . Thrombocytopenia, idiopathic ([place]) 11/08/2016  . COPD exacerbation ([place]) 11/08/2016  . Protein calorie malnutrition ([place]) 11/08/2016  . Suspected cancer of lower lobe of right lung ([place]) 02/11/2015  . Sepsis ([place]) 06/15/2013  . Atrial fibrillation with RVR ([place]) 06/13/2013  . Fever 06/11/2013  . Pyelonephritis, acute 06/11/2013  . Community acquired pneumonia 06/11/2013  . Acute encephalopathy 06/11/2013  . Renal failure, acute on chronic (HCC) 06/11/2013  . Thrombocytopenia ([place]) 06/11/2013  . History of prostate cancer 11/13/2011  . Diverticulosis 11/13/2011    Past Surgical History:  Procedure Laterality Date  . ABDOMINAL AORTIC ANEURYSM REPAIR  2001  . COLECTOMY  2005   diverticular rupture  . colon reastomosis  2005  . PROSTATECTOMY  2001   Dr Risa Grill       Home Medications    Prior to Admission medications   Medication Sig Start Date End Date Taking? Authorizing Provider  aspirin 81 MG tablet Take 81 mg by mouth daily.    [provider]  Multiple Vitamin (MULTIVITAMIN) tablet Take 1 tablet by mouth daily.    [provider]  omeprazole (PRILOSEC OTC) 20 MG tablet Take 20 mg by mouth daily.  [provider]  vitamin B-12 (CYANOCOBALAMIN) 1000 MCG tablet Take 1,000 mcg by mouth daily.    [provider]    Family History Family History  Problem Relation Age of Onset  . Heart disease Mother   . Stroke Father   . Alzheimer's disease Sister   . Parkinson's disease Brother     Social History Social History  Substance Use Topics  . Smoking status: Former Smoker    Packs/day: 1.00    Quit date: 04/18/1999  . Smokeless tobacco: Never Used  . Alcohol use Yes     Comment:  Beer-seldom      Allergies   Patient has no known allergies.   Review of Systems Review of Systems  Constitutional: Positive for fatigue. Negative for fever.  HENT: Negative for rhinorrhea and sore throat.   Respiratory: Positive for cough, sputum production and shortness of breath.   Cardiovascular: Negative for chest pain, orthopnea and leg swelling.  All other systems reviewed and are negative.    Physical Exam Updated Vital Signs BP 119/73   Pulse 72   Temp 97.8 F (36.6 C) (Oral)   Resp (!) 27   SpO2 95%   Physical Exam Physical Exam  Nursing note and vitals reviewed. Constitutional: Elderly man, thin, non-toxic, and in no acute distress Head: Normocephalic and atraumatic.  Mouth/Throat: Oropharynx is clear and moist.  Neck: Normal range of motion. Neck supple.  Cardiovascular: Normal rate and regular rhythm.   no lower extremity edema tenderness Pulmonary/Chest: Effort normal. Breath sounds diminished throughout Abdominal: Soft. There is no tenderness. There is no rebound and no guarding.  Musculoskeletal: Normal range of motion.  Neurological: Alert, no facial droop, fluent speech, moves all extremities symmetrically Skin: Skin is warm and dry.  Psychiatric: Cooperative   ED Treatments / Results  Labs (all labs ordered are listed, but only abnormal results are displayed) Labs Reviewed  BASIC METABOLIC PANEL - Abnormal; Notable for the following:       Result Value   Potassium 5.9 (*)    Glucose, Bld 124 (*)    BUN 51 (*)    Creatinine, Ser 3.82 (*)    GFR calc non Af Amer 13 (*)    GFR calc Af Amer 15 (*)    All other components within normal limits  CBC - Abnormal; Notable for the following:    WBC 3.3 (*)    RBC 3.60 (*)    Hemoglobin 11.4 (*)    HCT 35.0 (*)    Platelets 107 (*)    All other components within normal limits  BRAIN NATRIURETIC PEPTIDE - Abnormal; Notable for the following:    B Natriuretic Peptide 242.1 (*)    All other  components within normal limits  DIFFERENTIAL - Abnormal; Notable for the following:    Lymphs Abs 0.5 (*)    All other components within normal limits  URINALYSIS, ROUTINE W REFLEX MICROSCOPIC  I-STAT TROPONIN, ED    EKG  EKG Interpretation  Date/Time:  Wednesday November 08 2016 10:23:47 EDT Ventricular Rate:  87 PR Interval:    QRS Duration: 132 QT Interval:  389 QTC Calculation: 468 R Axis:   94 Text Interpretation:  Atrial fibrillation RBBB and LPFB Consider left ventricular hypertrophy Repol abnrm suggests ischemia, diffuse leads Confirmed by Brantley Stage [phone]) on 11/08/2016 10:43:56 AM       Radiology Dg Chest 2 View  Result Date: 11/08/2016 CLINICAL DATA:  Shortness of breath, weakness EXAM: CHEST  2 VIEW  COMPARISON:  06/11/2013 FINDINGS: There is hyperinflation of the lungs compatible with COPD. Previously seen right lower lobe mass not as well visualized as on prior CT. Bibasilar atelectasis. Heart is normal size. No acute bony abnormality. IMPRESSION: COPD. Right lower lobe mass not as well visualized as on prior CT. Bibasilar atelectasis. Electronically Signed   By: Rolm Baptise M.D.   On: 11/08/2016 10:53   Ct Chest Wo Contrast  Result Date: 11/08/2016 CLINICAL DATA:  History of lung mass. Worsening shortness of breath. EXAM: CT CHEST WITHOUT CONTRAST TECHNIQUE: Multidetector CT imaging of the chest was performed following the standard protocol without IV contrast. COMPARISON:  CT chest dated April 10, 2016. FINDINGS: Cardiovascular: The heart is normal in size. Coronary, aortic arch, and branch vessel atherosclerotic vascular disease. The ascending thoracic aorta stable in size, measuring 4.3 cm in diameter. Mediastinum/Nodes: Multiple subcentimeter mediastinal lymph nodes are unchanged compared to prior CT. No axillary lymphadenopathy. Moderate hiatal hernia again seen. Lungs/Pleura: The spiculated right lower lobe mass appears grossly similar in size, measuring 3.8 cm AP x  2.8 cm transverse. Similarly, the more peripheral mass like consolidation in the right lobe is also unchanged in size, measuring 3.0 x 1.5 cm. No new lung nodules or masses identified. Moderate upper lobe predominant centrilobular and paraseptal emphysema is similar to prior study. No pleural effusion or pneumothorax. Upper Abdomen: Numerous bilateral renal cysts are similar to prior study. Cholecystectomy. No acute abnormality. Musculoskeletal: Multilevel degenerative changes throughout the thoracic spine and multiple chronic compression deformities are similar to prior study. No suspicious osseous lesion. No fracture. IMPRESSION: 1. No significant interval change in size in the spiculated right lower lobe mass and slightly more peripheral mass-like consolidation within the right lower lobe, concerning for primary lung malignancy. 2. Unchanged ascending thoracic aortic aneurysm, measuring up to 4.3 cm. Recommend annual imaging followup by CTA or MRA. This recommendation follows 2010 ACCF/AHA/AATS/ACR/ASA/SCA/SCAI/SIR/STS/SVM Guidelines for the Diagnosis and Management of Patients with Thoracic Aortic Disease. Circulation. 2010; 121: S341-D622 3.  Emphysema. (ICD10-J43.9) 4.  Aortic Atherosclerosis (ICD10-I70.0). Electronically Signed   By: Titus Dubin M.D.   On: 11/08/2016 12:51    Procedures Procedures (including critical care time)  Medications Ordered in ED Medications  methylPREDNISolone sodium succinate (SOLU-MEDROL) 125 mg/2 mL injection 60 mg (not administered)  ipratropium-albuterol (DUONEB) 0.5-2.5 (3) MG/3ML nebulizer solution 3 mL (3 mLs Nebulization Given 11/08/16 1410)  0.9 %  sodium chloride infusion (not administered)  feeding supplement (ENSURE ENLIVE) (ENSURE ENLIVE) liquid 237 mL (not administered)  umeclidinium-vilanterol (ANORO ELLIPTA) 62.5-25 MCG/INH 1 puff (1 puff Inhalation Not Given 11/08/16 1400)  sodium chloride 0.9 % bolus 500 mL (0 mLs Intravenous Stopped 11/08/16 1306)    ipratropium-albuterol (DUONEB) 0.5-2.5 (3) MG/3ML nebulizer solution 3 mL (3 mLs Nebulization Given 11/08/16 1117)  methylPREDNISolone sodium succinate (SOLU-MEDROL) 125 mg/2 mL injection 125 mg (125 mg Intravenous Given 11/08/16 1118)     Initial Impression / Assessment and Plan / ED Course  I have reviewed the triage vital signs and the nursing notes.  Pertinent labs & imaging results that were available during my care of the patient were reviewed by me and considered in my medical decision making (see chart for details).     Presents with a gradually worsening shortness of breath and hypoxia. 87% on room air here in the ED, and placed on supplemental oxygen. [place], but in no respiratory distress. Poor breath sounds throughout. Treated for potential COPD exacerbation with Solu-Medrol breathing treatments.  He does feel improved. Chest x-ray visualized and does show right lower lung mass. CT chest subsequently shows unchanged mass. There is no evidence of edema or infiltrate. Unable to be obtained with contrast due to chronic kidney disease. Considered PE given remote history of DVT and known lung mass. However given abnormal chest x-ray and abnormal kidney function will be unable to obtain VQ scan her CTA. Given that symptoms are improving with COPD treatment, we'll treat as this for now and defer workup for PE unless symptoms are worsening. Discussed with hospitalist service who will admit for ongoing management.  Final Clinical Impressions(s) / ED Diagnoses   Final diagnoses:  COPD exacerbation ([place])  Acute respiratory failure with hypoxia [facility])    New Prescriptions New Prescriptions   No medications on file     Forde Dandy, MD 11/08/16 1430

## 2016-11-08 NOTE — H&P (Signed)
History and Physical    Derrick [name] XKG:[id number] DOB: May 07, 1928 DOA: 11/08/2016   PCP: Crist Infante, MD   Attending physician: Marily Memos  Patient coming from/Resides with: Private residence/wife  Chief Complaint: Shortness of breath  HPI: Derrick [name] is a 83 y.o. male with medical history significant for COPD not on home O2, atrial fibrillation not on anticoagulation, known spiculated right lung mass suspected to be malignancy the patient has deferred additional evaluation and biopsy, stage IV chronic kidney disease, chronic idiopathic thrombocytopenia, and prostate cancer. Patient presented to PCP office reporting increasing weakness and shortness of breath for several days. Wife reports patient has developed progressive dyspnea over one month. Patient was found to have O2 saturations room air 81% at PCP office and 87% in the ER. EMS was called to the office and patient was placed on 5 L oxygen with O2 sats 98%. He was also given one albuterol nebulizer. He also had reported some nonspecific chest pain that seemed pleuritic in nature noting symptoms were worse with inspiration. Upon arrival to the ER patient had diminished lung sounds throughout. He has been given duoneb 1 and Solu-Medrol 1 with improvement in symptoms. According to wife patient has baseline resting tachypnea.   Upon my evaluation of the patient he was comfortable lying on his left side with oxygen in place. He is somewhat hard of hearing. In addition to the above symptoms wife reports patient has had significant weight loss over several months noting that at previous visit to PCP he weight 164 lbs and weight today at office was 150.6 lbs. He is inconsistent with using ensure at home. He had also been given some sort of inhaler at PCP office but due to cost of around $350 per inhaler patient decided that he did not wish to utilize this medication. CT imaging in the ER revealed stability of right lung mass without any  evidence of postobstructive pneumonia. White count was normal. Patient has not had any lower extremity edema or other signs of possible DVT. Due to his poor creatinine clearance CT angiogram of the chest was not pursued.  ED Course:  Vital Signs: BP 118/89   Pulse 74   Temp 97.8 F (36.6 C) (Oral)   Resp (!) 26   SpO2 98%  2 view CXR: COPD, unable to visualize right lower lobe mass, bibasilar atelectasis CT chest without contrast: No significant interval change in size of the spiculated right lower lobe mass with slightly more peripheral masslike consolidation within the right lower lobe concerning for primary lung malignancy; unchanged size of a sending thoracic aneurysm measuring 4.3 cm. Other findings include emphysema. Lab data: Sodium 139, potassium 5.9, chloride 105, CO2 24, glucose 124, BUN 51, creatinine 3.82, calcium 10.1, anion gap 10, BNP 242, POC troponin 0.05, white count 3300 with normal differential, hemoglobin 11.4, platelets 107,000 Medications and treatments: Normal saline bolus 500 mL, DuoNeb 1, Solu Medrol 125 mg IV 1  Review of Systems:  In addition to the HPI above,  No Fever-chills, myalgias or other constitutional symptoms; wife reports patient does complain of feeling cold all the time No Headache, changes with Vision or hearing, new weakness, tingling, numbness in any extremity, dizziness, dysarthria or word finding difficulty, gait disturbance or imbalance, tremors or seizure activity No problems swallowing food or Liquids, indigestion/reflux, choking or coughing while eating, abdominal pain with or after eating No Chest pain, palpitations, orthopnea  No Abdominal pain, N/V, melena,hematochezia, dark tarry stools, constipation No dysuria, malodorous urine, hematuria  or flank pain No new skin rashes, lesions, masses or bruises, No new joint pains, aches, swelling or redness No recent unintentional weight gain No polyuria, polydypsia or polyphagia   Past  Medical History:  Diagnosis Date  . Atrial fibrillation ([place])    In the context of urosepsis  . Emphysema lung ([place])   . Prostate cancer ([place])   . Urosepsis     Past Surgical History:  Procedure Laterality Date  . ABDOMINAL AORTIC ANEURYSM REPAIR  2001  . COLECTOMY  2005   diverticular rupture  . colon reastomosis  2005  . PROSTATECTOMY  2001   Dr Risa Grill    Social History   Social History  . Marital status: Married    Spouse name: N/A  . Number of children: N/A  . Years of education: N/A   Occupational History  . Not on file.   Social History Main Topics  . Smoking status: Former Smoker    Packs/day: 1.00    Quit date: 04/18/1999  . Smokeless tobacco: Never Used  . Alcohol use Yes     Comment: Beer-seldom   . Drug use: No  . Sexual activity: No   Other Topics Concern  . Not on file   Social History Narrative  . No narrative on file    Mobility: Not obtained Work history: Not obtained   No Known Allergies  Family History  Problem Relation Age of Onset  . Heart disease Mother   . Stroke Father   . Alzheimer's disease Sister   . Parkinson's disease Brother      Prior to Admission medications   Medication Sig Start Date End Date Taking? Authorizing Provider  aspirin 81 MG tablet Take 81 mg by mouth daily.    [provider]  ciprofloxacin (CIPRO) 500 MG tablet Take 1 tablet (500 mg total) by mouth daily with breakfast. 06/16/13   Caren Griffins, MD  Multiple Vitamin (MULTIVITAMIN) tablet Take 1 tablet by mouth daily.    [provider]  omeprazole (PRILOSEC OTC) 20 MG tablet Take 20 mg by mouth daily.    [provider]  vitamin B-12 (CYANOCOBALAMIN) 1000 MCG tablet Take 1,000 mcg by mouth daily.    [provider]    Physical Exam: Vitals:   11/08/16 1245 11/08/16 1300 11/08/16 1315 11/08/16 1330  BP: 130/61 131/89 137/88 118/89  Pulse: 77  (!) 47 74  Resp: (!) 30 (!) 28  (!) 26  Temp:      TempSrc:        SpO2: 96%  97% 98%      Constitutional: NAD, calm, comfortable-Appears somewhat underway Eyes: PERRL, lids and conjunctivae normal ENMT: Mucous membranes are dry. Posterior pharynx clear of any exudate or lesions. Age-appropriate dentition.  Neck: normal, supple, no masses, no thyromegaly Respiratory: clear to auscultation bilaterally with slight decreased lung sounds in the bases but no crackles or wheezing Normal respiratory effort. Without accessory muscle use at rest. Does have a baseline resting tachypnea with respiratory rate around 30 breaths per minute -oxygen at 2 L/m Cardiovascular: Irregular rate and rhythm (sinus with PACs), no murmurs / rubs / gallops. No extremity edema. 2+ pedal pulses. No carotid bruits.  Abdomen: no tenderness, no masses palpated. No hepatosplenomegaly. Bowel sounds positive.  Musculoskeletal: no clubbing / cyanosis. No joint deformity upper and lower extremities. Good ROM, no contractures. Normal muscle tone.  Skin: no rashes, lesions, ulcers. No induration Neurologic: CN 2-12 grossly intact-hard of hearing. Sensation intact, DTR  normal. Strength 5/5 x all 4 extremities.  Psychiatric: Normal judgment and insight. Alert and oriented x 3. Normal mood.    Labs on Admission: I have personally reviewed following labs and imaging studies  CBC:  Recent Labs Lab 11/08/16 1030  WBC 3.3*  NEUTROABS 2.2  HGB 11.4*  HCT 35.0*  MCV 97.2  PLT 073*   Basic Metabolic Panel:  Recent Labs Lab 11/08/16 1030  NA 139  K 5.9*  CL 105  CO2 24  GLUCOSE 124*  BUN 51*  CREATININE 3.82*  CALCIUM 10.1   GFR: CrCl cannot be calculated (Unknown ideal weight.). Liver Function Tests: No results for input(s): AST, ALT, ALKPHOS, BILITOT, PROT, ALBUMIN in the last 168 hours. No results for input(s): LIPASE, AMYLASE in the last 168 hours. No results for input(s): AMMONIA in the last 168 hours. Coagulation Profile: No results for input(s): INR, PROTIME in the last  168 hours. Cardiac Enzymes: No results for input(s): CKTOTAL, CKMB, CKMBINDEX, TROPONINI in the last 168 hours. BNP (last 3 results) No results for input(s): PROBNP in the last 8760 hours. HbA1C: No results for input(s): HGBA1C in the last 72 hours. CBG: No results for input(s): GLUCAP in the last 168 hours. Lipid Profile: No results for input(s): CHOL, HDL, LDLCALC, TRIG, CHOLHDL, LDLDIRECT in the last 72 hours. Thyroid Function Tests: No results for input(s): TSH, T4TOTAL, FREET4, T3FREE, THYROIDAB in the last 72 hours. Anemia Panel: No results for input(s): VITAMINB12, FOLATE, FERRITIN, TIBC, IRON, RETICCTPCT in the last 72 hours. Urine analysis:    Component Value Date/Time   COLORURINE YELLOW 06/11/2013 0159   APPEARANCEUR CLEAR 06/11/2013 0159   LABSPEC 1.004 (L) 06/11/2013 0159   PHURINE 6.5 06/11/2013 0159   GLUCOSEU NEGATIVE 06/11/2013 0159   HGBUR SMALL (A) 06/11/2013 0159   BILIRUBINUR NEGATIVE 06/11/2013 0159   KETONESUR NEGATIVE 06/11/2013 0159   PROTEINUR NEGATIVE 06/11/2013 0159   UROBILINOGEN 0.2 06/11/2013 0159   NITRITE NEGATIVE 06/11/2013 0159   LEUKOCYTESUR NEGATIVE 06/11/2013 0159   Sepsis Labs: @LABRCNTIP (procalcitonin:4,lacticidven:4) )No results found for this or any previous visit (from the past 240 hour(s)).   Radiological Exams on Admission: Dg Chest 2 View  Result Date: 11/08/2016 CLINICAL DATA:  Shortness of breath, weakness EXAM: CHEST  2 VIEW COMPARISON:  06/11/2013 FINDINGS: There is hyperinflation of the lungs compatible with COPD. Previously seen right lower lobe mass not as well visualized as on prior CT. Bibasilar atelectasis. Heart is normal size. No acute bony abnormality. IMPRESSION: COPD. Right lower lobe mass not as well visualized as on prior CT. Bibasilar atelectasis. Electronically Signed   By: Rolm Baptise M.D.   On: 11/08/2016 10:53   Ct Chest Wo Contrast  Result Date: 11/08/2016 CLINICAL DATA:  History of lung mass. Worsening  shortness of breath. EXAM: CT CHEST WITHOUT CONTRAST TECHNIQUE: Multidetector CT imaging of the chest was performed following the standard protocol without IV contrast. COMPARISON:  CT chest dated April 10, 2016. FINDINGS: Cardiovascular: The heart is normal in size. Coronary, aortic arch, and branch vessel atherosclerotic vascular disease. The ascending thoracic aorta stable in size, measuring 4.3 cm in diameter. Mediastinum/Nodes: Multiple subcentimeter mediastinal lymph nodes are unchanged compared to prior CT. No axillary lymphadenopathy. Moderate hiatal hernia again seen. Lungs/Pleura: The spiculated right lower lobe mass appears grossly similar in size, measuring 3.8 cm AP x 2.8 cm transverse. Similarly, the more peripheral mass like consolidation in the right lobe is also unchanged in size, measuring 3.0 x 1.5 cm. No new lung nodules  or masses identified. Moderate upper lobe predominant centrilobular and paraseptal emphysema is similar to prior study. No pleural effusion or pneumothorax. Upper Abdomen: Numerous bilateral renal cysts are similar to prior study. Cholecystectomy. No acute abnormality. Musculoskeletal: Multilevel degenerative changes throughout the thoracic spine and multiple chronic compression deformities are similar to prior study. No suspicious osseous lesion. No fracture. IMPRESSION: 1. No significant interval change in size in the spiculated right lower lobe mass and slightly more peripheral mass-like consolidation within the right lower lobe, concerning for primary lung malignancy. 2. Unchanged ascending thoracic aortic aneurysm, measuring up to 4.3 cm. Recommend annual imaging followup by CTA or MRA. This recommendation follows 2010 ACCF/AHA/AATS/ACR/ASA/SCA/SCAI/SIR/STS/SVM Guidelines for the Diagnosis and Management of Patients with Thoracic Aortic Disease. Circulation. 2010; 121: U440-H474 3.  Emphysema. (ICD10-J43.9) 4.  Aortic Atherosclerosis (ICD10-I70.0). Electronically Signed    By: Titus Dubin M.D.   On: 11/08/2016 12:51    EKG: (Independently reviewed) sinus rhythm with PACs, underlying RBBB and LBBB with evidence of LV strain  Assessment/Plan Principal Problem:   Acute respiratory failure with hypoxia 2/2 COPD exacerbation -Patient presents with progressive respiratory symptoms over at least 30 days worsened over the past few days with documented hypoxemia on room air -Suspect 2/2 COPD exacerbation and likely progression of underlying COPD -Uncertain if has undergone formal PFTs but likely has advanced gold stage -Was prescribed inhaler (?? dual acting LABA) but due to cost patient did not use -I have tentatively begun Anoro during this admission and have asked case management to explore co-pay of this or of appropriate therapeutic substitution -Continue scheduled DuoNeb -Solu-Medrol 60 mg IV every 12 hours -Continue oxygen -Obtain ambulatory room air saturations-suspect patient will qualify for home O2 to use after discharge -He will also need a rescue inhaler of albuterol and will also need nebulizer for anticipated DuoNeb prescription at time of discharge  Active Problems:   Mass of right lung -Current CT of the chest shows stability of mass but still concerning for underlying malignancy -Patient continues to decline formal workup/biopsy -No evidence of progression such as enlarged lymph nodes in the chest, increased size of mass, or obstructive pneumonia process    CKD (chronic kidney disease) stage 4, GFR 15-29 ml/min  -Renal function stable and at baseline    Left ventricular diastolic dysfunction, NYHA class 2 -Appears euvolemic and actually appears dry (see below) -Not on ACE inhibitor secondary to advanced chronic kidney disease -Not on diuretics -Obtain echocardiogram (2015)-I suspect patient may have advanced pulmonary hypertension/cor pulmonale    Atrial fibrillation maintaining sinus rhythm -Pulses are regular but clearly with sinus  rhythm with frequent PACs -Not on rate controlling agents based on current medication reconciliation available -Not on chronic anticoagulation    Thrombocytopenia, idiopathic -Current platelets are stable and greater than 100,000    Protein calorie malnutrition  -Wife reports weight loss over the past few months from 164 pounds to 150.6 pounds -Patient inconsistent with usage of Ensure beverage home -Protein supplementation -Nutrition consultation -Likely representative of advanced COPD and underlying suspected malignancy -Also appears dry given the above-stated weight loss with poor oral intake and presentation with mild hyperkalemia in setting of known severe chronic kidney disease therefore at least for first 24 hours will give low rate IV fluid at 50/hr      DVT prophylaxis: Dose adjusted Lovenox Code Status: DO NOT RESUSCITATE  Family Communication: Wife Disposition Plan: Home Consults called: None     Derrick Fearnow L. ANP-BC [facility] Pager [phone]  If 7PM-7AM, please contact night-coverage [email or web address] Password [email or web address]  11/08/2016, 1:54 PM

## 2016-11-08 NOTE — ED Notes (Signed)
Called lab to add on BNP and Diff

## 2016-11-08 NOTE — ED Notes (Signed)
Patient transported to CT 

## 2016-11-08 NOTE — ED Notes (Signed)
Pt returned from CT °

## 2016-11-09 DIAGNOSIS — D693 Immune thrombocytopenic purpura: Secondary | ICD-10-CM | POA: Diagnosis not present

## 2016-11-09 DIAGNOSIS — R918 Other nonspecific abnormal finding of lung field: Secondary | ICD-10-CM

## 2016-11-09 DIAGNOSIS — E43 Unspecified severe protein-calorie malnutrition: Secondary | ICD-10-CM

## 2016-11-09 DIAGNOSIS — N184 Chronic kidney disease, stage 4 (severe): Secondary | ICD-10-CM | POA: Diagnosis not present

## 2016-11-09 DIAGNOSIS — J9601 Acute respiratory failure with hypoxia: Secondary | ICD-10-CM | POA: Diagnosis not present

## 2016-11-09 DIAGNOSIS — I4891 Unspecified atrial fibrillation: Secondary | ICD-10-CM

## 2016-11-09 DIAGNOSIS — I519 Heart disease, unspecified: Secondary | ICD-10-CM

## 2016-11-09 DIAGNOSIS — J441 Chronic obstructive pulmonary disease with (acute) exacerbation: Secondary | ICD-10-CM

## 2016-11-09 LAB — RESPIRATORY PANEL BY PCR
Adenovirus: NOT DETECTED
BORDETELLA PERTUSSIS-RVPCR: NOT DETECTED
CORONAVIRUS 229E-RVPPCR: NOT DETECTED
Chlamydophila pneumoniae: NOT DETECTED
Coronavirus HKU1: NOT DETECTED
Coronavirus NL63: NOT DETECTED
Coronavirus OC43: NOT DETECTED
INFLUENZA A-RVPPCR: NOT DETECTED
INFLUENZA B-RVPPCR: NOT DETECTED
MYCOPLASMA PNEUMONIAE-RVPPCR: NOT DETECTED
Metapneumovirus: NOT DETECTED
PARAINFLUENZA VIRUS 4-RVPPCR: NOT DETECTED
Parainfluenza Virus 1: NOT DETECTED
Parainfluenza Virus 2: NOT DETECTED
Parainfluenza Virus 3: NOT DETECTED
RESPIRATORY SYNCYTIAL VIRUS-RVPPCR: NOT DETECTED
Rhinovirus / Enterovirus: NOT DETECTED

## 2016-11-09 LAB — CBC
HCT: 31.5 % — ABNORMAL LOW (ref 39.0–52.0)
Hemoglobin: 10.1 g/dL — ABNORMAL LOW (ref 13.0–17.0)
MCH: 30.8 pg (ref 26.0–34.0)
MCHC: 32.1 g/dL (ref 30.0–36.0)
MCV: 96 fL (ref 78.0–100.0)
PLATELETS: 91 10*3/uL — AB (ref 150–400)
RBC: 3.28 MIL/uL — ABNORMAL LOW (ref 4.22–5.81)
RDW: 12.4 % (ref 11.5–15.5)
WBC: 2.4 10*3/uL — ABNORMAL LOW (ref 4.0–10.5)

## 2016-11-09 LAB — BASIC METABOLIC PANEL
Anion gap: 10 (ref 5–15)
BUN: 59 mg/dL — ABNORMAL HIGH (ref 6–20)
CALCIUM: 9.7 mg/dL (ref 8.9–10.3)
CO2: 23 mmol/L (ref 22–32)
CREATININE: 3.88 mg/dL — AB (ref 0.61–1.24)
Chloride: 104 mmol/L (ref 101–111)
GFR calc Af Amer: 15 mL/min — ABNORMAL LOW (ref >60)
GFR calc non Af Amer: 13 mL/min — ABNORMAL LOW (ref >60)
GLUCOSE: 190 mg/dL — AB (ref 65–99)
Potassium: 4.6 mmol/L (ref 3.5–5.1)
Sodium: 137 mmol/L (ref 135–145)

## 2016-11-09 LAB — URINALYSIS, ROUTINE W REFLEX MICROSCOPIC
BACTERIA UA: NONE SEEN
Bilirubin Urine: NEGATIVE
Glucose, UA: 150 mg/dL — AB
Ketones, ur: NEGATIVE mg/dL
Leukocytes, UA: NEGATIVE
NITRITE: NEGATIVE
Protein, ur: 30 mg/dL — AB
SPECIFIC GRAVITY, URINE: 1.017 (ref 1.005–1.030)
pH: 5 (ref 5.0–8.0)

## 2016-11-09 MED ORDER — PREDNISONE 20 MG PO TABS: 40.0000 mg | ORAL_TABLET | Freq: Every day | ORAL | 0 refills | Status: AC

## 2016-11-09 MED ORDER — IPRATROPIUM-ALBUTEROL 0.5-2.5 (3) MG/3ML IN SOLN: 3.0000 mL | Freq: Four times a day (QID) | RESPIRATORY_TRACT | 0 refills | Status: DC | PRN

## 2016-11-09 MED ORDER — ENSURE ENLIVE PO LIQD: 237.0000 mL | Freq: Two times a day (BID) | ORAL | 0 refills | Status: AC

## 2016-11-09 MED ORDER — ORAL CARE MOUTH RINSE
15.0000 mL | Freq: Two times a day (BID) | OROMUCOSAL | Status: DC
  Administered 2016-11-09: 15 mL via OROMUCOSAL

## 2016-11-09 MED ORDER — IPRATROPIUM-ALBUTEROL 0.5-2.5 (3) MG/3ML IN SOLN: 3.0000 mL | Freq: Two times a day (BID) | RESPIRATORY_TRACT | Status: DC

## 2016-11-09 NOTE — Discharge Instructions (Addendum)
Derrick [name],  Your admitted for a COPD exacerbation. You were given steroids and breathing treatments. Your symptoms have improved. You will go home on oxygen which may be temporary in the setting of acute exacerbation. Please follow-up with your primary care physician so they can wean your oxygen down.  Your blood counts were low. This will need to be followed up with your primary care physician and possibly a hematologist.  There was some blood in your urine. Please follow up withy your primary care physician to have your urine retested.

## 2016-11-09 NOTE — Progress Notes (Signed)
CM received consult for umeclidinium/ vilanterol, benefits check in process. CM to f/u with results. Whitman Hero RN,BSN,CM

## 2016-11-09 NOTE — Progress Notes (Signed)
Derrick [name] to be D/C'd Home per MD order. Discussed with the patient and all questions fully answered.  Allergies as of 11/09/2016   No Known Allergies     Medication List    TAKE these medications   ALIGN 4 MG Caps Take 4 mg by mouth daily.   aspirin 81 MG tablet Take 81 mg by mouth daily.   cholecalciferol 1000 units tablet Commonly known as:  VITAMIN D Take 1,000 Units by mouth daily.   feeding supplement (ENSURE ENLIVE) Liqd Take 237 mLs by mouth 2 (two) times daily between meals.   ipratropium-albuterol 0.5-2.5 (3) MG/3ML Soln Commonly known as:  DUONEB Take 3 mLs by nebulization every 6 (six) hours as needed.   multivitamin tablet Take 1 tablet by mouth daily.   pravastatin 20 MG tablet Commonly known as:  PRAVACHOL Take 20 mg by mouth daily.   predniSONE 20 MG tablet Commonly known as:  DELTASONE Take 2 tablets (40 mg total) by mouth daily with breakfast.   ranitidine 150 MG tablet Commonly known as:  ZANTAC Take 150 mg by mouth daily.   vitamin B-12 1000 MCG tablet Commonly known as:  CYANOCOBALAMIN Take 1,000 mcg by mouth daily.            Durable Medical Equipment        Start     Ordered   11/09/16 1658  For home use only DME Nebulizer machine  Once    Question:  Patient needs a nebulizer to treat with the following condition  Answer:  COPD (chronic obstructive pulmonary disease) (Derrick [name])   11/09/16 1657   11/09/16 1223  For home use only DME oxygen  Once    Question Answer Comment  Mode or (Route) Nasal cannula   Liters per Minute 3   Frequency Continuous (stationary and portable oxygen unit needed)   Oxygen delivery system Gas      11/09/16 1222      VVS, Skin clean, dry and intact without evidence of skin break down, no evidence of skin tears noted.  IV catheter discontinued intact. Site without signs and symptoms of complications. Dressing and pressure applied.  An After Visit Summary was printed and given to the patient.  Patient  escorted via [place], and D/C home via private auto.  Derrick [name]  11/09/2016 5:17 PM

## 2016-11-09 NOTE — Discharge Summary (Signed)
Physician Discharge Summary  Derrick [name] JJH:[id number] DOB: [birth year] DOA: 11/08/2016  PCP: Crist Infante, MD  Admit date: 11/08/2016 Discharge date: 11/09/2016  Admitted From: Home Disposition: Home  Recommendations for Outpatient Follow-up:  1. Follow up with PCP in 1 week 2. Please obtain BMP/CBC in one week 3. Please follow up on the following pending results: None  Home Health: PT/OT Equipment/Devices: Oxygen  Discharge Condition: Stable CODE STATUS: DNR Diet recommendation: Heart healthy   Brief/Interim Summary:  Admission HPI written by Waldemar Dickens, MD and Erin Hearing, NP   Chief Complaint: Shortness of breath  HPI: Derrick [name] is a 83 y.o. male with medical history significant for COPD not on home O2, atrial fibrillation not on anticoagulation, known spiculated right lung mass suspected to be malignancy the patient has deferred additional evaluation and biopsy, stage IV chronic kidney disease, chronic idiopathic thrombocytopenia, and prostate cancer. Patient presented to PCP office reporting increasing weakness and shortness of breath for several days. Wife reports patient has developed progressive dyspnea over one month. Patient was found to have O2 saturations room air 81% at PCP office and 87% in the ER. EMS was called to the office and patient was placed on 5 L oxygen with O2 sats 98%. He was also given one albuterol nebulizer. He also had reported some nonspecific chest pain that seemed pleuritic in nature noting symptoms were worse with inspiration. Upon arrival to the ER patient had diminished lung sounds throughout. He has been given duoneb 1 and Solu-Medrol 1 with improvement in symptoms. According to wife patient has baseline resting tachypnea.   Upon my evaluation of the patient he was comfortable lying on his left side with oxygen in place. He is somewhat hard of hearing. In addition to the above symptoms wife reports patient has had significant  weight loss over several months noting that at previous visit to PCP he weight 164 lbs and weight today at office was 150.6 lbs. He is inconsistent with using ensure at home. He had also been given some sort of inhaler at PCP office but due to cost of around $350 per inhaler patient decided that he did not wish to utilize this medication. CT imaging in the ER revealed stability of right lung mass without any evidence of postobstructive pneumonia. White count was normal. Patient has not had any lower extremity edema or other signs of possible DVT. Due to his poor creatinine clearance CT angiogram of the chest was not pursued.  ED Course:  Vital Signs: BP 118/89   Pulse 74   Temp 97.8 F (36.6 C) (Oral)   Resp (!) 26   SpO2 98%  2 view CXR: COPD, unable to visualize right lower lobe mass, bibasilar atelectasis CT chest without contrast: No significant interval change in size of the spiculated right lower lobe mass with slightly more peripheral masslike consolidation within the right lower lobe concerning for primary lung malignancy; unchanged size of a sending thoracic aneurysm measuring 4.3 cm. Other findings include emphysema. Lab data: Sodium 139, potassium 5.9, chloride 105, CO2 24, glucose 124, BUN 51, creatinine 3.82, calcium 10.1, anion gap 10, BNP 242, POC troponin 0.05, white count 3300 with normal differential, hemoglobin 11.4, platelets 107,000 Medications and treatments: Normal saline bolus 500 mL, DuoNeb 1, Solu Medrol 125 mg IV 1    Hospital course:  Acute respiratory failure with hypoxia COPD exacerbation Patient treated with DuoNeb Medrol. Oxygen continued and weaned down as tolerated. Patient required oxygen on ambulation. Respiratory virus  panel negative. Prescription for oxygen supplied at discharge. Discharged on DuoNeb, and steroids.  Massive right lung Stable on CT. Patient is not wanting formal workup/biopsy.  CKD stage IV Stable renal function.  Left ventricular  dysfunction Euvolemic.  Atrial fibrillation Sinus rhythm currently. Rate control.  Thrombocytopenia, hepatic Stable  Protein calorie malnutrition, severe Continued protein supplementation. Dietary consult placed.  Pancytopenia Patient has a history of anemia and thrombocytopenia. There is some leukopenia while in the hospital. Unsure of chronicity. ANC of 2200. Afebrile. Recommend outpatient follow-up with repeat CBC and possible hematology follow-up.    Discharge Diagnoses:  Principal Problem:   Acute respiratory failure with hypoxia ([facility]) Active Problems:   Atrial fibrillation (HCC)   Mass of right lung   CKD (chronic kidney disease) stage 4, GFR 15-29 ml/min (HCC)   Left ventricular diastolic dysfunction, NYHA class 2   Thrombocytopenia, idiopathic (HCC)   COPD exacerbation (HCC)   Protein calorie malnutrition [facility])    Discharge Instructions  Discharge Instructions    Call MD for:  difficulty breathing, headache or visual disturbances    Complete by:  As directed    Call MD for:  persistant nausea and vomiting    Complete by:  As directed    Call MD for:  severe uncontrolled pain    Complete by:  As directed    Diet - low sodium heart healthy    Complete by:  As directed    Increase activity slowly    Complete by:  As directed      Allergies as of 11/09/2016   No Known Allergies     Medication List    TAKE these medications   ALIGN 4 MG Caps Take 4 mg by mouth daily.   aspirin 81 MG tablet Take 81 mg by mouth daily.   cholecalciferol 1000 units tablet Commonly known as:  VITAMIN D Take 1,000 Units by mouth daily.   feeding supplement (ENSURE ENLIVE) Liqd Take 237 mLs by mouth 2 (two) times daily between meals.   ipratropium-albuterol 0.5-2.5 (3) MG/3ML Soln Commonly known as:  DUONEB Take 3 mLs by nebulization every 6 (six) hours as needed.   multivitamin tablet Take 1 tablet by mouth daily.   pravastatin 20 MG tablet Commonly known as:   PRAVACHOL Take 20 mg by mouth daily.   predniSONE 20 MG tablet Commonly known as:  DELTASONE Take 2 tablets (40 mg total) by mouth daily with breakfast.   ranitidine 150 MG tablet Commonly known as:  ZANTAC Take 150 mg by mouth daily.   vitamin B-12 1000 MCG tablet Commonly known as:  CYANOCOBALAMIN Take 1,000 mcg by mouth daily.            Durable Medical Equipment        Start     Ordered   11/09/16 1223  For home use only DME oxygen  Once    Question Answer Comment  Mode or (Route) Nasal cannula   Liters per Minute 3   Frequency Continuous (stationary and portable oxygen unit needed)   Oxygen delivery system Gas      11/09/16 1222     Follow-up Information    Crist Infante, MD. Schedule an appointment as soon as possible for a visit in 1 week.   Specialty:  Internal Medicine Contact information: [address] [place] [address] [phone]          No Known Allergies  Consultations:  None   Procedures/Studies: Dg Chest 2 View  Result  Date: 11/08/2016 CLINICAL DATA:  Shortness of breath, weakness EXAM: CHEST  2 VIEW COMPARISON:  06/11/2013 FINDINGS: There is hyperinflation of the lungs compatible with COPD. Previously seen right lower lobe mass not as well visualized as on prior CT. Bibasilar atelectasis. Heart is normal size. No acute bony abnormality. IMPRESSION: COPD. Right lower lobe mass not as well visualized as on prior CT. Bibasilar atelectasis. Electronically Signed   By: Rolm Baptise M.D.   On: 11/08/2016 10:53   Ct Chest Wo Contrast  Result Date: 11/08/2016 CLINICAL DATA:  History of lung mass. Worsening shortness of breath. EXAM: CT CHEST WITHOUT CONTRAST TECHNIQUE: Multidetector CT imaging of the chest was performed following the standard protocol without IV contrast. COMPARISON:  CT chest dated April 10, 2016. FINDINGS: Cardiovascular: The heart is normal in size. Coronary, aortic arch, and branch vessel atherosclerotic vascular  disease. The ascending thoracic aorta stable in size, measuring 4.3 cm in diameter. Mediastinum/Nodes: Multiple subcentimeter mediastinal lymph nodes are unchanged compared to prior CT. No axillary lymphadenopathy. Moderate hiatal hernia again seen. Lungs/Pleura: The spiculated right lower lobe mass appears grossly similar in size, measuring 3.8 cm AP x 2.8 cm transverse. Similarly, the more peripheral mass like consolidation in the right lobe is also unchanged in size, measuring 3.0 x 1.5 cm. No new lung nodules or masses identified. Moderate upper lobe predominant centrilobular and paraseptal emphysema is similar to prior study. No pleural effusion or pneumothorax. Upper Abdomen: Numerous bilateral renal cysts are similar to prior study. Cholecystectomy. No acute abnormality. Musculoskeletal: Multilevel degenerative changes throughout the thoracic spine and multiple chronic compression deformities are similar to prior study. No suspicious osseous lesion. No fracture. IMPRESSION: 1. No significant interval change in size in the spiculated right lower lobe mass and slightly more peripheral mass-like consolidation within the right lower lobe, concerning for primary lung malignancy. 2. Unchanged ascending thoracic aortic aneurysm, measuring up to 4.3 cm. Recommend annual imaging followup by CTA or MRA. This recommendation follows 2010 ACCF/AHA/AATS/ACR/ASA/SCA/SCAI/SIR/STS/SVM Guidelines for the Diagnosis and Management of Patients with Thoracic Aortic Disease. Circulation. 2010; 121: Z329-J242 3.  Emphysema. (ICD10-J43.9) 4.  Aortic Atherosclerosis (ICD10-I70.0). Electronically Signed   By: Titus Dubin M.D.   On: 11/08/2016 12:51      Subjective: Dyspnea and chest pain.  Discharge Exam: Vitals:   11/08/16 2246 11/09/16 0510  BP: (!) 110/57 127/70  Pulse: 83 73  Resp:  (!) 22  Temp:  97.7 F (36.5 C)   Vitals:   11/09/16 0500 11/09/16 0510 11/09/16 0802 11/09/16 0917  BP:  127/70    Pulse:  73     Resp:  (!) 22    Temp:  97.7 F (36.5 C)    TempSrc:  Oral    SpO2:  98% 97% 94%  Weight: 63.5 kg (140 lb 1.6 oz)     Height:        General: Pt is alert, awake, not in acute distress Cardiovascular: RRR, S1/S2 +, no rubs, no gallops Respiratory: CTA bilaterally, no wheezing, no rhonchi Abdominal: Soft, NT, ND, bowel sounds + Extremities: no edema, no cyanosis    The results of significant diagnostics from this hospitalization (including imaging, microbiology, ancillary and laboratory) are listed below for reference.     Microbiology: Recent Results (from the past 240 hour(s))  Respiratory Panel by PCR     Status: None   Collection Time: 11/08/16  6:10 PM  Result Value Ref Range Status   Adenovirus NOT DETECTED NOT DETECTED Final  Coronavirus 229E NOT DETECTED NOT DETECTED Final   Coronavirus HKU1 NOT DETECTED NOT DETECTED Final   Coronavirus NL63 NOT DETECTED NOT DETECTED Final   Coronavirus OC43 NOT DETECTED NOT DETECTED Final   Metapneumovirus NOT DETECTED NOT DETECTED Final   Rhinovirus / Enterovirus NOT DETECTED NOT DETECTED Final   Influenza A NOT DETECTED NOT DETECTED Final   Influenza B NOT DETECTED NOT DETECTED Final   Parainfluenza Virus 1 NOT DETECTED NOT DETECTED Final   Parainfluenza Virus 2 NOT DETECTED NOT DETECTED Final   Parainfluenza Virus 3 NOT DETECTED NOT DETECTED Final   Parainfluenza Virus 4 NOT DETECTED NOT DETECTED Final   Respiratory Syncytial Virus NOT DETECTED NOT DETECTED Final   Bordetella pertussis NOT DETECTED NOT DETECTED Final   Chlamydophila pneumoniae NOT DETECTED NOT DETECTED Final   Mycoplasma pneumoniae NOT DETECTED NOT DETECTED Final     Labs: BNP (last 3 results)  Recent Labs  11/08/16 1030  BNP 401.0*   Basic Metabolic Panel:  Recent Labs Lab 11/08/16 1030 11/08/16 1721 11/09/16 0301  NA 139  --  137  K 5.9*  --  4.6  CL 105  --  104  CO2 24  --  23  GLUCOSE 124*  --  190*  BUN 51*  --  59*  CREATININE  3.82*  --  3.88*  CALCIUM 10.1  --  9.7  MG  --  2.0  --   PHOS  --  4.4  --    CBC:  Recent Labs Lab 11/08/16 1030 11/09/16 0301  WBC 3.3* 2.4*  NEUTROABS 2.2  --   HGB 11.4* 10.1*  HCT 35.0* 31.5*  MCV 97.2 96.0  PLT 107* 91*   Urinalysis    Component Value Date/Time   COLORURINE YELLOW 11/09/2016 0459   APPEARANCEUR CLEAR 11/09/2016 0459   LABSPEC 1.017 11/09/2016 0459   PHURINE 5.0 11/09/2016 0459   GLUCOSEU 150 (A) 11/09/2016 0459   HGBUR MODERATE (A) 11/09/2016 0459   BILIRUBINUR NEGATIVE 11/09/2016 0459   KETONESUR NEGATIVE 11/09/2016 0459   PROTEINUR 30 (A) 11/09/2016 0459   UROBILINOGEN 0.2 06/11/2013 0159   NITRITE NEGATIVE 11/09/2016 0459   LEUKOCYTESUR NEGATIVE 11/09/2016 0459   Sepsis Labs Invalid input(s): PROCALCITONIN,  WBC,  LACTICIDVEN Microbiology Recent Results (from the past 240 hour(s))  Respiratory Panel by PCR     Status: None   Collection Time: 11/08/16  6:10 PM  Result Value Ref Range Status   Adenovirus NOT DETECTED NOT DETECTED Final   Coronavirus 229E NOT DETECTED NOT DETECTED Final   Coronavirus HKU1 NOT DETECTED NOT DETECTED Final   Coronavirus NL63 NOT DETECTED NOT DETECTED Final   Coronavirus OC43 NOT DETECTED NOT DETECTED Final   Metapneumovirus NOT DETECTED NOT DETECTED Final   Rhinovirus / Enterovirus NOT DETECTED NOT DETECTED Final   Influenza A NOT DETECTED NOT DETECTED Final   Influenza B NOT DETECTED NOT DETECTED Final   Parainfluenza Virus 1 NOT DETECTED NOT DETECTED Final   Parainfluenza Virus 2 NOT DETECTED NOT DETECTED Final   Parainfluenza Virus 3 NOT DETECTED NOT DETECTED Final   Parainfluenza Virus 4 NOT DETECTED NOT DETECTED Final   Respiratory Syncytial Virus NOT DETECTED NOT DETECTED Final   Bordetella pertussis NOT DETECTED NOT DETECTED Final   Chlamydophila pneumoniae NOT DETECTED NOT DETECTED Final   Mycoplasma pneumoniae NOT DETECTED NOT DETECTED Final    SIGNED:   Cordelia Poche, MD [facility] 11/09/2016, 2:08 PM Pager [phone]  If 7PM-7AM, please contact  night-coverage [email or web address] Password [email or web address]

## 2016-11-09 NOTE — Progress Notes (Signed)
SATURATION QUALIFICATIONS: (This note is used to comply with regulatory documentation for home oxygen) (per [facility])  Patient Saturations on Room Air at Rest = 87-92% (flactuates)  Patient Saturations on Room Air while Ambulating = 85%  Patient Saturations on 2-3 Liters of oxygen while Ambulating = 92%  Please briefly explain why patient needs home oxygen: Desaturation on room air while ambulating.

## 2016-11-09 NOTE — Progress Notes (Signed)
Pt need oxygen, alternate treatments were tried but not successful. Whitman Hero RN,BSN,CM

## 2016-11-09 NOTE — Evaluation (Signed)
Physical Therapy Evaluation Patient Details Name: Derrick [name] MRN: [id number] DOB: 29-Nov-1928 Today's Date: 11/09/2016   History of Present Illness  Pt is a 83 yo male admitted through ED with progressive weakness, fatigue and shortness of breath with weight loss. Pt was diagnosed with COPD exacerbation, L ventricular dysfunction, and acute respiratory failure. PMH significant for COPD, A-fib, R lung mass, CKD IV, chronic thrombocytopenia, prostate CA  Clinical Impression  Pt presents with the above diagnosis and below deficits for therapy evaluation. Prior to admission, pt lived with his wife in a single level home and was completely independent including performing yard work, driving, and doing all his own ADLs and IADLs. Pt requires Min to Mod A for all mobility this session except for bed mobility and is unsteady on his feet. Pt will benefit from RW for safety with gait. Pt continues to benefit from continued acute PT follow-up in order to address the below deficits prior to DC.     Follow Up Recommendations Home health PT;Supervision for mobility/OOB    Equipment Recommendations  None recommended by PT;Other (comment) (family will have access to RW)    Recommendations for Other Services       Precautions / Restrictions Precautions Precautions: Fall Restrictions Weight Bearing Restrictions: No      Mobility  Bed Mobility Overal bed mobility: Modified Independent             General bed mobility comments: able to get EOB without hands on assist with use of raillings  Transfers Overall transfer level: Needs assistance Equipment used: None Transfers: Sit to/from Stand Sit to Stand: Min assist         General transfer comment: Min A to power up and to stabilize once at EOB  Ambulation/Gait Ambulation/Gait assistance: Min assist;Mod assist Ambulation Distance (Feet): 20 Feet Assistive device: 1 person hand held assist Gait Pattern/deviations: Step-through  pattern;Decreased stride length Gait velocity: decreased Gait velocity interpretation: Below normal speed for age/gender General Gait Details: slow gait speed, increased LOB with turning requiring Mod  A for clinician to stabilize. Min A for majority of gait for stability. Pt will benefit from RW for safety with gait next session. O2 sats drop to 87 on RA and quickly raise back up to 92%.   Stairs            Wheelchair Mobility    Modified Rankin (Stroke Patients Only)       Balance Overall balance assessment: Needs assistance Sitting-balance support: No upper extremity supported;Feet supported Sitting balance-Leahy Scale: Good     Standing balance support: Single extremity supported;No upper extremity supported Standing balance-Leahy Scale: Fair Standing balance comment: able to stand without assistance briefly                             Pertinent Vitals/Pain Pain Assessment: No/denies pain    Home Living Family/patient expects to be discharged to:: Private residence Living Arrangements: Spouse/significant other Available Help at Discharge: Family;Available 24 hours/day Type of Home: House Home Access: Stairs to enter Entrance Stairs-Rails: None Entrance Stairs-Number of Steps: 2 Home Layout: One level Home Equipment: Walker - 2 wheels;Cane - single point      Prior Function Level of Independence: Independent         Comments: completely independent prior to admission     Hand Dominance   Dominant Hand: Right    Extremity/Trunk Assessment   Upper Extremity Assessment Upper Extremity Assessment: Defer  to OT evaluation    Lower Extremity Assessment Lower Extremity Assessment: Generalized weakness    Cervical / Trunk Assessment Cervical / Trunk Assessment: Kyphotic  Communication   Communication: No difficulties  Cognition Arousal/Alertness: Awake/alert Behavior During Therapy: WFL for tasks assessed/performed Overall Cognitive  Status: Within Functional Limits for tasks assessed                                 General Comments: HOH      General Comments General comments (skin integrity, edema, etc.): wife present throughout session and will be present at DC    Exercises     Assessment/Plan    PT Assessment Patient needs continued PT services  PT Problem List Decreased activity tolerance;Decreased balance;Decreased coordination;Decreased knowledge of use of DME;Decreased safety awareness       PT Treatment Interventions DME instruction;Gait training;Stair training;Functional mobility training;Therapeutic activities;Therapeutic exercise;Balance training    PT Goals (Current goals can be found in the Care Plan section)  Acute Rehab PT Goals Patient Stated Goal: to feel better PT Goal Formulation: With patient/family Time For Goal Achievement: 11/23/16 Potential to Achieve Goals: Good    Frequency Min 3X/week   Barriers to discharge        Co-evaluation               AM-PAC PT "6 Clicks" Daily Activity  Outcome Measure Difficulty turning over in bed (including adjusting bedclothes, sheets and blankets)?: None Difficulty moving from lying on back to sitting on the side of the bed? : A Little Difficulty sitting down on and standing up from a chair with arms (e.g., wheelchair, bedside commode, etc,.)?: Total Help needed moving to and from a bed to chair (including a wheelchair)?: A Lot Help needed walking in hospital room?: A Lot Help needed climbing 3-5 steps with a railing? : A Lot 6 Click Score: 14    End of Session Equipment Utilized During Treatment: Gait belt;Oxygen Activity Tolerance: Patient tolerated treatment well Patient left: in chair;with call bell/phone within reach;with family/visitor present Nurse Communication: Mobility status PT Visit Diagnosis: Unsteadiness on feet (R26.81);Difficulty in walking, not elsewhere classified (R26.2)    Time: 4268-3419 PT  Time Calculation (min) (ACUTE ONLY): 24 min   Charges:   PT Evaluation $PT Eval Moderate Complexity: 1 Procedure PT Treatments $Therapeutic Activity: 8-22 mins   PT G Codes:   PT G-Codes **NOT FOR INPATIENT CLASS** Functional Assessment Tool Used: AM-PAC 6 Clicks Basic Mobility;Clinical judgement Functional Limitation: Mobility: Walking and moving around Mobility: Walking and Moving Around Current Status (Q2229): At least 40 percent but less than 60 percent impaired, limited or restricted Mobility: Walking and Moving Around Goal Status [phone]): At least 1 percent but less than 20 percent impaired, limited or restricted    Scheryl Marten PT, DPT  [phone]   Derrick [name] 11/09/2016, 10:00 AM

## 2016-11-09 NOTE — Progress Notes (Signed)
Initial Nutrition Assessment  DOCUMENTATION CODES:   Severe malnutrition in context of chronic illness  INTERVENTION:  1. Continue Ensure Enlive po BID, each supplement provides 350 kcal and 20 grams of protein 2. Continue MVI w/ Minerals  NUTRITION DIAGNOSIS:   Malnutrition (Severe) related to chronic illness (COPD, Lung Mass) as evidenced by severe depletion of muscle mass, severe depletion of body fat, percent weight loss  GOAL:   Patient will meet greater than or equal to 90% of their needs  MONITOR:   PO intake, I & O's, Supplement acceptance, Labs, Weight trends  REASON FOR ASSESSMENT:   Malnutrition Screening Tool    ASSESSMENT:   83 yo male with PMH significant for COPD, Afib, possible R Lung malignancy, Stg IV CKD, Prostate Cancer presents with dyspnea over one month, chest pain, COPD exacerbation, Significant wt loss  Spoke with patient, wife at bedside, wife does most of the talking. She reports patient was not eating much PTA. Normally at cereal for breakfast, drank ensure 1-2 per day, and ate a cooked meal for dinner. She states his portion sizes were very small, was not consuming much. States patient got sick initially about 1 month ago, was diagnosed with a UTI, has since lost weight from 169 lb to 150.6 lb, a severe 19#/11% wt loss over that time frame. She states he is feeling much better today. Ate scrambled eggs, toast, fruit, coffee, orange juice and drank an ensure. He is also taking a probiotic called ALIGN that was recommended by his PCP. They deny any issues chewing/swallowing/choking  Intake/Output Summary (Last 24 hours) at 11/09/16 1413 Last data filed at 11/09/16 1025  Gross per 24 hour  Intake           518.33 ml  Output              300 ml  Net           218.33 ml    Nutrition-Focused physical exam completed. Findings are moderate-severe fat depletion, severe muscle depletion, and no edema.  Medications reviewed and include:  Solumedrol NS at  63mL/hr Labs reviewed  Diet Order:  Diet regular Room service appropriate? Yes; Fluid consistency: Thin Diet - low sodium heart healthy  Skin:  Reviewed, no issues  Last BM:  11/07/2016  Height:   Ht Readings from Last 1 Encounters:  11/08/16 6\' 2"  (1.88 m)    Weight:   Wt Readings from Last 1 Encounters:  11/09/16 140 lb 1.6 oz (63.5 kg)    Ideal Body Weight:  86.36 kg  BMI:  Body mass index is 17.99 kg/m.  Estimated Nutritional Needs:   Kcal:  0569-7948 calories (MSJ x1.2-1.3)  Protein:  89-102 grams (1.4-1.6g/kg)  Fluid:  1.6-1.8L  EDUCATION NEEDS:   No education needs identified at this time  Satira Anis. Anastasya Jewell, MS, RD LDN Inpatient Clinical Dietitian Pager [phone]

## 2016-11-09 NOTE — Evaluation (Addendum)
Occupational Therapy Evaluation Patient Details Name: Derrick [name] MRN: [id number] DOB: May 05, 1928 Today's Date: 11/09/2016    History of Present Illness Pt is a 83 yo male admitted through ED with progressive weakness, fatigue and shortness of breath with weight loss. Pt was diagnosed with COPD exacerbation, L ventricular dysfunction, and acute respiratory failure. PMH significant for COPD, A-fib, R lung mass, CKD IV, chronic thrombocytopenia, prostate CA   Clinical Impression   Pt with decline in function and safety with ADLs and ADL mobility with decreased strength, balance and endurance. initiated energy conservation trg with pt and his wife. Pt not wearing )2 upon OT arrival and O2 SATs 89-92% fluctuating. O2 SATs decreasing with minimal exertion to 86%. Pt would benefit from acute OT services to address impairments to increase level of function and safety Patient Saturations on Room Air before activity = 89-92% Patient Saturations on RA while Ambulating to bathroom= 86-87% Patient Saturations on RA after activity seated activity = 89-91% Patient Saturations on 2-3 L O2 after activity seated activity = 91-92% Please briefly explain why patient needs home oxygen: Desaturation on room air with mobility/ADL acitivity    Follow Up Recommendations  Home health OT    Equipment Recommendations  None recommended by OT    Recommendations for Other Services       Precautions / Restrictions Precautions Precautions: Fall Restrictions Weight Bearing Restrictions: No      Mobility Bed Mobility Overal bed mobility: Modified Independent             General bed mobility comments: pt up in recliner upon arrival  Transfers Overall transfer level: Needs assistance Equipment used: None Transfers: Sit to/from Stand Sit to Stand: Min assist         General transfer comment: Min A to power up, verbal cues for safety. O2 SATs decreased to 86% on RA during ADL mobility, pt able to  recover to 89-91% in less than10 seconds when seated on RA and 2-3 L 91-92%    Balance Overall balance assessment: Needs assistance Sitting-balance support: No upper extremity supported;Feet supported Sitting balance-Leahy Scale: Good     Standing balance support: Single extremity supported;No upper extremity supported Standing balance-Leahy Scale: Fair Standing balance comment: able to stand without assistance briefly                           ADL either performed or assessed with clinical judgement   ADL Overall ADL's : Needs assistance/impaired     Grooming: Wash/dry hands;Wash/dry face;Standing;Min guard   Upper Body Bathing: Set up;Sitting   Lower Body Bathing: Minimal assistance Lower Body Bathing Details (indicate cue type and reason): min A during sit - stand due to decreased balance and O2 SATs Upper Body Dressing : Set up;Sitting   Lower Body Dressing: Minimal assistance Lower Body Dressing Details (indicate cue type and reason): min A during sit - stand due to decreased balance and O2 SATs Toilet Transfer: Minimal assistance;Ambulation;Comfort height toilet;Grab bars;Cueing for safety   Toileting- Clothing Manipulation and Hygiene: Min guard;Sit to/from stand Toileting - Clothing Manipulation Details (indicate cue type and reason): min guard A during sit - stand due to decreased balance and O2 SATs Tub/ Shower Transfer: Ambulation;Grab bars;Minimal assistance;Cueing for safety   Functional mobility during ADLs: Minimal assistance;Cueing for safety General ADL Comments: min A during sit - stand due to decreased balance and O2 SATs decreasing     Vision Baseline Vision/History: Wears glasses Wears Glasses:  Reading only Patient Visual Report: No change from baseline                  Pertinent Vitals/Pain Pain Assessment: No/denies pain     Hand Dominance Right   Extremity/Trunk Assessment Upper Extremity Assessment Upper Extremity Assessment:  Generalized weakness   Lower Extremity Assessment Lower Extremity Assessment: Defer to PT evaluation   Cervical / Trunk Assessment Cervical / Trunk Assessment: Kyphotic   Communication Communication Communication: No difficulties   Cognition Arousal/Alertness: Awake/alert Behavior During Therapy: WFL for tasks assessed/performed Overall Cognitive Status: Within Functional Limits for tasks assessed                                 General Comments: HOH   General Comments  wife present throughout session and will be present at [place] expects to be discharged to:: Private residence Living Arrangements: Spouse/significant other Available Help at Discharge: Family;Available 24 hours/day Type of Home: House Home Access: Stairs to enter [employer] of Steps: 2 Entrance Stairs-Rails: None Home Layout: One level     Bathroom Shower/Tub: Teacher, early years/pre: Standard     Home Equipment: Environmental consultant - 2 wheels;Cane - single point          Prior Functioning/Environment Level of Independence: Independent        Comments: completely independent prior to admission        OT Problem List: Decreased strength;Impaired balance (sitting and/or standing);Decreased activity tolerance;Decreased knowledge of use of DME or AE;Cardiopulmonary status limiting activity      OT Treatment/Interventions: Self-care/ADL training;DME and/or AE instruction;Therapeutic activities;Energy conservation;Patient/family education;Balance training    OT Goals(Current goals can be found in the care plan section) Acute Rehab OT Goals Patient Stated Goal: to feel better OT Goal Formulation: With patient/family Time For Goal Achievement: 11/16/16 Potential to Achieve Goals: Good ADL Goals Pt Will Perform Grooming: with supervision;with set-up;standing;with caregiver independent in assisting Pt Will Perform Lower Body Bathing:  with min guard assist;with caregiver independent in assisting;sitting/lateral leans;sit to/from stand Pt Will Perform Lower Body Dressing: with min guard assist;with caregiver independent in assisting;sitting/lateral leans;sit to/from stand Pt Will Transfer to Toilet: with min guard assist;with supervision;ambulating;regular height toilet;grab bars Pt Will Perform Toileting - Clothing Manipulation and hygiene: with supervision;sit to/from stand;with caregiver independent in assisting Pt Will Perform Tub/Shower Transfer: with supervision;ambulating;grab bars;shower seat Additional ADL Goal #1: Pt will verbalize and demo 3 energy conservation techniques during ADLs and ADL mobility  OT Frequency: Min 2X/week   Barriers to D/C:    no barriers                     AM-PAC PT "6 Clicks" Daily Activity     Outcome Measure Help from another person eating meals?: None Help from another person taking care of personal grooming?: A Little Help from another person toileting, which includes using toliet, bedpan, or urinal?: A Little Help from another person bathing (including washing, rinsing, drying)?: A Little Help from another person to put on and taking off regular upper body clothing?: None Help from another person to put on and taking off regular lower body clothing?: A Little 6 Click Score: 20   End of Session Equipment Utilized During Treatment: Gait belt  Activity Tolerance: Patient tolerated treatment well;Patient limited by fatigue Patient left: with  call bell/phone within reach;in chair;with family/visitor present  OT Visit Diagnosis: Unsteadiness on feet (R26.81);Muscle weakness (generalized) (M62.81)                Time: 0865-7846 OT Time Calculation (min): 17 min Charges:  OT General Charges $OT Visit: 1 Procedure OT Evaluation $OT Eval Low Complexity: 1 Procedure G-Codes: OT G-codes **NOT FOR INPATIENT CLASS** Functional Assessment Tool Used: AM-PAC 6 Clicks Daily  Activity Functional Limitation: Self care Self Care Current Status (N6295): At least 1 percent but less than 20 percent impaired, limited or restricted Self Care Goal Status (M8413): At least 1 percent but less than 20 percent impaired, limited or restricted     Britt Bottom 11/09/2016, 10:59 AM

## 2016-11-14 ENCOUNTER — Emergency Department (HOSPITAL_COMMUNITY): Payer: Medicare Other

## 2016-11-14 ENCOUNTER — Inpatient Hospital Stay (HOSPITAL_COMMUNITY)
Admission: EM | Admit: 2016-11-14 | Discharge: 2016-11-17 | DRG: 690 | Disposition: A | Discharge status: Home / Self Care | Payer: Medicare Other | Attending: Internal Medicine | Admitting: Internal Medicine

## 2016-11-14 ENCOUNTER — Encounter (HOSPITAL_COMMUNITY): Payer: Self-pay

## 2016-11-14 DIAGNOSIS — R531 Weakness: Secondary | ICD-10-CM | POA: Diagnosis present

## 2016-11-14 DIAGNOSIS — Z7189 Other specified counseling: Secondary | ICD-10-CM

## 2016-11-14 DIAGNOSIS — N390 Urinary tract infection, site not specified: Secondary | ICD-10-CM | POA: Diagnosis not present

## 2016-11-14 DIAGNOSIS — R748 Abnormal levels of other serum enzymes: Secondary | ICD-10-CM | POA: Diagnosis not present

## 2016-11-14 DIAGNOSIS — Z87891 Personal history of nicotine dependence: Secondary | ICD-10-CM

## 2016-11-14 DIAGNOSIS — Z515 Encounter for palliative care: Secondary | ICD-10-CM | POA: Diagnosis present

## 2016-11-14 DIAGNOSIS — R7989 Other specified abnormal findings of blood chemistry: Secondary | ICD-10-CM

## 2016-11-14 DIAGNOSIS — Z820 Family history of epilepsy and other diseases of the nervous system: Secondary | ICD-10-CM

## 2016-11-14 DIAGNOSIS — J449 Chronic obstructive pulmonary disease, unspecified: Secondary | ICD-10-CM | POA: Diagnosis not present

## 2016-11-14 DIAGNOSIS — Z7982 Long term (current) use of aspirin: Secondary | ICD-10-CM

## 2016-11-14 DIAGNOSIS — N184 Chronic kidney disease, stage 4 (severe): Secondary | ICD-10-CM | POA: Diagnosis not present

## 2016-11-14 DIAGNOSIS — R918 Other nonspecific abnormal finding of lung field: Secondary | ICD-10-CM | POA: Diagnosis present

## 2016-11-14 DIAGNOSIS — D693 Immune thrombocytopenic purpura: Secondary | ICD-10-CM | POA: Diagnosis present

## 2016-11-14 DIAGNOSIS — Z8249 Family history of ischemic heart disease and other diseases of the circulatory system: Secondary | ICD-10-CM

## 2016-11-14 DIAGNOSIS — Z9049 Acquired absence of other specified parts of digestive tract: Secondary | ICD-10-CM

## 2016-11-14 DIAGNOSIS — N3000 Acute cystitis without hematuria: Secondary | ICD-10-CM

## 2016-11-14 DIAGNOSIS — Z823 Family history of stroke: Secondary | ICD-10-CM

## 2016-11-14 DIAGNOSIS — Z66 Do not resuscitate: Secondary | ICD-10-CM | POA: Diagnosis present

## 2016-11-14 DIAGNOSIS — Z8546 Personal history of malignant neoplasm of prostate: Secondary | ICD-10-CM

## 2016-11-14 DIAGNOSIS — I4891 Unspecified atrial fibrillation: Secondary | ICD-10-CM | POA: Diagnosis present

## 2016-11-14 DIAGNOSIS — Z9079 Acquired absence of other genital organ(s): Secondary | ICD-10-CM

## 2016-11-14 DIAGNOSIS — Z79899 Other long term (current) drug therapy: Secondary | ICD-10-CM

## 2016-11-14 DIAGNOSIS — R778 Other specified abnormalities of plasma proteins: Secondary | ICD-10-CM | POA: Diagnosis present

## 2016-11-14 DIAGNOSIS — Z9981 Dependence on supplemental oxygen: Secondary | ICD-10-CM

## 2016-11-14 DIAGNOSIS — R627 Adult failure to thrive: Secondary | ICD-10-CM | POA: Diagnosis present

## 2016-11-14 DIAGNOSIS — Z8679 Personal history of other diseases of the circulatory system: Secondary | ICD-10-CM

## 2016-11-14 LAB — CBC WITH DIFFERENTIAL/PLATELET
BASOS ABS: 0 10*3/uL (ref 0.0–0.1)
BASOS PCT: 0 %
EOS PCT: 0 %
Eosinophils Absolute: 0 10*3/uL (ref 0.0–0.7)
HCT: 33.8 % — ABNORMAL LOW (ref 39.0–52.0)
Hemoglobin: 10.7 g/dL — ABNORMAL LOW (ref 13.0–17.0)
Lymphocytes Relative: 5 %
Lymphs Abs: 0.4 10*3/uL — ABNORMAL LOW (ref 0.7–4.0)
MCH: 30.8 pg (ref 26.0–34.0)
MCHC: 31.7 g/dL (ref 30.0–36.0)
MCV: 97.4 fL (ref 78.0–100.0)
MONO ABS: 1 10*3/uL (ref 0.1–1.0)
Monocytes Relative: 12 %
Neutro Abs: 6.7 10*3/uL (ref 1.7–7.7)
Neutrophils Relative %: 83 %
PLATELETS: 131 10*3/uL — AB (ref 150–400)
RBC: 3.47 MIL/uL — ABNORMAL LOW (ref 4.22–5.81)
RDW: 13 % (ref 11.5–15.5)
WBC: 8.2 10*3/uL (ref 4.0–10.5)

## 2016-11-14 LAB — URINALYSIS, ROUTINE W REFLEX MICROSCOPIC
BILIRUBIN URINE: NEGATIVE
Glucose, UA: NEGATIVE mg/dL
KETONES UR: NEGATIVE mg/dL
Nitrite: NEGATIVE
PH: 5 (ref 5.0–8.0)
PROTEIN: 30 mg/dL — AB
Specific Gravity, Urine: 1.017 (ref 1.005–1.030)

## 2016-11-14 LAB — COMPREHENSIVE METABOLIC PANEL
ALBUMIN: 3.2 g/dL — AB (ref 3.5–5.0)
ALT: 17 U/L (ref 17–63)
AST: 21 U/L (ref 15–41)
Alkaline Phosphatase: 58 U/L (ref 38–126)
Anion gap: 8 (ref 5–15)
BUN: 82 mg/dL — AB (ref 6–20)
CHLORIDE: 110 mmol/L (ref 101–111)
CO2: 24 mmol/L (ref 22–32)
Calcium: 9.9 mg/dL (ref 8.9–10.3)
Creatinine, Ser: 3.32 mg/dL — ABNORMAL HIGH (ref 0.61–1.24)
GFR calc Af Amer: 18 mL/min — ABNORMAL LOW (ref >60)
GFR, EST NON AFRICAN AMERICAN: 15 mL/min — AB (ref >60)
Glucose, Bld: 107 mg/dL — ABNORMAL HIGH (ref 65–99)
POTASSIUM: 4.9 mmol/L (ref 3.5–5.1)
Sodium: 142 mmol/L (ref 135–145)
TOTAL PROTEIN: 6.2 g/dL — AB (ref 6.5–8.1)
Total Bilirubin: 1.2 mg/dL (ref 0.3–1.2)

## 2016-11-14 LAB — I-STAT TROPONIN, ED: TROPONIN I, POC: 0.16 ng/mL — AB (ref 0.00–0.08)

## 2016-11-14 LAB — TROPONIN I: TROPONIN I: 0.58 ng/mL — AB (ref <0.03)

## 2016-11-14 MED ORDER — TRAZODONE HCL 50 MG PO TABS
25.0000 mg | ORAL_TABLET | Freq: Every evening | ORAL | Status: DC | PRN
  Administered 2016-11-15: 25 mg via ORAL
  Filled 2016-11-14: qty 1

## 2016-11-14 MED ORDER — PREDNISONE 20 MG PO TABS
20.0000 mg | ORAL_TABLET | Freq: Every day | ORAL | Status: DC
  Administered 2016-11-15: 20 mg via ORAL
  Filled 2016-11-14: qty 1

## 2016-11-14 MED ORDER — HEPARIN SODIUM (PORCINE) 5000 UNIT/ML IJ SOLN
5000.0000 [IU] | Freq: Three times a day (TID) | INTRAMUSCULAR | Status: DC
  Administered 2016-11-14 – 2016-11-15 (×4): 5000 [IU] via SUBCUTANEOUS
  Filled 2016-11-14 (×3): qty 1

## 2016-11-14 MED ORDER — SODIUM CHLORIDE 0.9 % IV SOLN
INTRAVENOUS | Status: AC
  Administered 2016-11-14: 14:00:00 via INTRAVENOUS

## 2016-11-14 MED ORDER — ASPIRIN EC 81 MG PO TBEC
81.0000 mg | DELAYED_RELEASE_TABLET | Freq: Every day | ORAL | Status: DC
  Administered 2016-11-15: 81 mg via ORAL
  Filled 2016-11-14: qty 1

## 2016-11-14 MED ORDER — ONDANSETRON HCL 4 MG PO TABS: 4.0000 mg | ORAL_TABLET | Freq: Four times a day (QID) | ORAL | Status: DC | PRN

## 2016-11-14 MED ORDER — IPRATROPIUM-ALBUTEROL 0.5-2.5 (3) MG/3ML IN SOLN
3.0000 mL | Freq: Four times a day (QID) | RESPIRATORY_TRACT | Status: DC
  Administered 2016-11-14 – 2016-11-15 (×5): 3 mL via RESPIRATORY_TRACT
  Filled 2016-11-14 (×5): qty 3

## 2016-11-14 MED ORDER — ONDANSETRON HCL 4 MG/2ML IJ SOLN: 4.0000 mg | Freq: Four times a day (QID) | INTRAMUSCULAR | Status: DC | PRN

## 2016-11-14 MED ORDER — SENNOSIDES-DOCUSATE SODIUM 8.6-50 MG PO TABS: 1.0000 | ORAL_TABLET | Freq: Every evening | ORAL | Status: DC | PRN

## 2016-11-14 MED ORDER — VITAMIN B-12 1000 MCG PO TABS
1000.0000 ug | ORAL_TABLET | Freq: Every day | ORAL | Status: DC
  Administered 2016-11-15: 1000 ug via ORAL
  Filled 2016-11-14: qty 1

## 2016-11-14 MED ORDER — ACETAMINOPHEN 650 MG RE SUPP
650.0000 mg | Freq: Four times a day (QID) | RECTAL | Status: DC | PRN
Start: 2016-11-14 — End: 2016-11-17

## 2016-11-14 MED ORDER — ENSURE ENLIVE PO LIQD
237.0000 mL | Freq: Two times a day (BID) | ORAL | Status: DC
  Administered 2016-11-15 – 2016-11-17 (×5): 237 mL via ORAL

## 2016-11-14 MED ORDER — FAMOTIDINE 20 MG PO TABS
20.0000 mg | ORAL_TABLET | Freq: Every day | ORAL | Status: DC
  Administered 2016-11-15 – 2016-11-16 (×2): 20 mg via ORAL
  Filled 2016-11-14 (×2): qty 1

## 2016-11-14 MED ORDER — IPRATROPIUM-ALBUTEROL 0.5-2.5 (3) MG/3ML IN SOLN
3.0000 mL | Freq: Once | RESPIRATORY_TRACT | Status: AC
  Administered 2016-11-14: 3 mL via RESPIRATORY_TRACT
  Filled 2016-11-14: qty 3

## 2016-11-14 MED ORDER — ACETAMINOPHEN 325 MG PO TABS: 650.0000 mg | ORAL_TABLET | Freq: Four times a day (QID) | ORAL | Status: DC | PRN

## 2016-11-14 MED ORDER — DEXTROSE 5 % IV SOLN
1.0000 g | Freq: Once | INTRAVENOUS | Status: AC
  Administered 2016-11-14: 1 g via INTRAVENOUS
  Filled 2016-11-14: qty 10

## 2016-11-14 MED ORDER — ASPIRIN 81 MG PO CHEW
324.0000 mg | CHEWABLE_TABLET | Freq: Once | ORAL | Status: AC
  Administered 2016-11-14: 324 mg via ORAL
  Filled 2016-11-14: qty 4

## 2016-11-14 MED ORDER — ASPIRIN 81 MG PO TABS: 81.0000 mg | ORAL_TABLET | Freq: Every day | ORAL | Status: DC

## 2016-11-14 MED ORDER — DEXTROSE 5 % IV SOLN
1.0000 g | INTRAVENOUS | Status: DC
  Administered 2016-11-15: 1 g via INTRAVENOUS
  Filled 2016-11-14: qty 10

## 2016-11-14 MED ORDER — PRAVASTATIN SODIUM 20 MG PO TABS
20.0000 mg | ORAL_TABLET | Freq: Every day | ORAL | Status: DC
  Administered 2016-11-15: 20 mg via ORAL
  Filled 2016-11-14: qty 1

## 2016-11-14 MED ORDER — HYDROCODONE-ACETAMINOPHEN 5-325 MG PO TABS: 1.0000 | ORAL_TABLET | ORAL | Status: DC | PRN

## 2016-11-14 NOTE — Care Management Note (Signed)
Case Management Note  Patient Details  Name: Derrick [name] MRN: [id number] Date of Birth: 03-06-1929  Subjective/Objective: 83 y.o. 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  CM consulted for obs notification.  Pt spouse states pt has no current HHS, previously used [facility] for services.  Denies DME needs, already has shower chair, walker, access to a wheel chair.  Action/Plan: CM noted OT recommended HHS OT on last admission.  CM will need to follow for HHS needs at D/C.  Expected Discharge Date:    Unknown              Expected Discharge Plan:  [place]  Discharge planning Services  CM Consult  Post Acute Care Choice:   [facility] in past) Choice offered to:  Patient, Spouse  Status of Service:  In process, will continue to follow  Corky Crafts, RN 11/14/2016, 3:37 PM

## 2016-11-14 NOTE — ED Notes (Signed)
Pt

## 2016-11-14 NOTE — Progress Notes (Signed)
Results for [name], Derrick COVELLO (MRN [id number]) as of 11/14/2016 20:00  Ref. Range 11/14/2016 18:05  Troponin I Latest Ref Range: <0.03 ng/mL 0.58 [facility])  NP made aware.

## 2016-11-14 NOTE — H&P (Signed)
History and Physical    Derrick [name] DDU:[id number] DOB: Jul 19, 1928 DOA: 11/14/2016  PCP: Crist Infante, MD Patient coming from: home  Chief Complaint: fall/weakness  HPI: Derrick [name] is a 83 y.o. 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 Initial evaluation reveals a urinary tract infection and an elevated troponin.  Information is obtained from the wife who is at the bedside. She states 4 days ago patient was discharged with home oxygen. The first day he "did well". With each passing day he became a little more week eating and drinking a little last having more difficulty ambulating to the bathroom. Associated symptoms include worsening cough/chest congestion at night to the point that "no one in the house could sleep". She denies any productive cough, fever complaints of chills headache dizziness. No nausea vomiting diarrhea. Patient denies dysuria hematuria frequency or urgency. She states that respiratory effort improved immediately once he received nebulizer emergency department.    ED Course: He is afebrile hemodynamically stable oxygen saturation greater than 90% on 4 L. He is provided with Rocephin and aspirin  Review of Systems: As per HPI otherwise all other systems reviewed and are negative.   Ambulatory Status: Ambulates with cane/walker with a very unsteady gait  Past Medical History:  Diagnosis Date  . Atrial fibrillation ([place])    In the context of urosepsis  . Emphysema lung ([place])   . Prostate cancer ([place])   . Urosepsis     Past Surgical History:  Procedure  Laterality Date  . ABDOMINAL AORTIC ANEURYSM REPAIR  2001  . COLECTOMY  2005   diverticular rupture  . colon reastomosis  2005  . PROSTATECTOMY  2001   Dr Risa Grill    Social History   Social History  . Marital status: Married    Spouse name: N/A  . Number of children: N/A  . Years of education: N/A   Occupational History  . Not on file.   Social History Main Topics  . Smoking status: Former Smoker    Packs/day: 1.00    Quit date: 04/18/1999  . Smokeless tobacco: Never Used  . Alcohol use Yes     Comment: Beer-seldom   . Drug use: No  . Sexual activity: No   Other Topics Concern  . Not on file   Social History Narrative  . No narrative on file    No Known Allergies  Family History  Problem Relation Age of Onset  . Heart disease Mother   . Stroke Father   . Alzheimer's disease Sister   . Parkinson's disease Brother     Prior to Admission medications   Medication Sig Start Date End Date Taking? Authorizing Provider  aspirin 81 MG tablet Take 81 mg by mouth daily.   Yes [provider]  cholecalciferol (VITAMIN D) 1000 units tablet Take 1,000 Units by mouth daily.   Yes [provider]  feeding supplement, ENSURE ENLIVE, (ENSURE ENLIVE) LIQD Take 237 mLs by mouth 2 (two) times daily between meals. 11/09/16  Yes Mariel Aloe, MD  Multiple Vitamin (MULTIVITAMIN) tablet Take 1 tablet by mouth daily.   Yes [provider]  pravastatin (PRAVACHOL) 20 MG tablet Take 20 mg  by mouth daily.   Yes [provider]  Probiotic Product (ALIGN) 4 MG CAPS Take 4 mg by mouth daily.   Yes [provider]  ranitidine (ZANTAC) 150 MG tablet Take 150 mg by mouth daily.   Yes [provider]  vitamin B-12 (CYANOCOBALAMIN) 1000 MCG tablet Take 1,000 mcg by mouth daily.   Yes [provider]    Physical Exam: Vitals:   11/14/16 1130 11/14/16 1200 11/14/16 1215 11/14/16 1230  BP: (!) 146/89 135/83 138/76 104/76  Pulse: 93  98 94 90  Resp: (!) 27 (!) 30 18 (!) 22  Temp:      TempSrc:      SpO2: 98% 98% 100% 98%  Weight:      Height:         General:  Appears calm and comfortable,Somewhat lethargic but easily aroused somewhat pale/gray Eyes:  PERRL, EOMI, normal lids, iris ENT:  grossly normal hearing, lips & tongue, mucous membranes of his mouth are pale dry Neck:  no LAD, masses or thyromegaly Cardiovascular:  Irregularly irregular, no m/r/g. No LE edema.  Respiratory:  Normal respiratory effort respirations somewhat shallow breath sounds are distant somewhat coarse fine crackles on right Abdomen:  soft, ntnd, NABS Skin:  no rash or induration seen on limited exam, pale dry Musculoskeletal:  grossly normal tone BUE/BLE, good ROM, no bony abnormality Psychiatric:  grossly normal mood and affect, speech fluent and appropriate, AOx3 Neurologic:  CN 2-12 grossly intact, moves all extremities in coordinated fashion, sensation intact  Labs on Admission: I have personally reviewed following labs and imaging studies  CBC:  Recent Labs Lab 11/08/16 1030 11/09/16 0301 11/14/16 0854  WBC 3.3* 2.4* 8.2  NEUTROABS 2.2  --  6.7  HGB 11.4* 10.1* 10.7*  HCT 35.0* 31.5* 33.8*  MCV 97.2 96.0 97.4  PLT 107* 91* 101*   Basic Metabolic Panel:  Recent Labs Lab 11/08/16 1030 11/08/16 1721 11/09/16 0301 11/14/16 0854  NA 139  --  137 142  K 5.9*  --  4.6 4.9  CL 105  --  104 110  CO2 24  --  23 24  GLUCOSE 124*  --  190* 107*  BUN 51*  --  59* 82*  CREATININE 3.82*  --  3.88* 3.32*  CALCIUM 10.1  --  9.7 9.9  MG  --  2.0  --   --   PHOS  --  4.4  --   --    GFR: Estimated Creatinine Clearance: 15.4 mL/min (A) (by C-G formula based on SCr of 3.32 mg/dL (H)). Liver Function Tests:  Recent Labs Lab 11/14/16 0854  AST 21  ALT 17  ALKPHOS 58  BILITOT 1.2  PROT 6.2*  ALBUMIN 3.2*   No results for input(s): LIPASE, AMYLASE in the last 168 hours. No results for input(s): AMMONIA in the last 168  hours. Coagulation Profile: No results for input(s): INR, PROTIME in the last 168 hours. Cardiac Enzymes: No results for input(s): CKTOTAL, CKMB, CKMBINDEX, TROPONINI in the last 168 hours. BNP (last 3 results) No results for input(s): PROBNP in the last 8760 hours. HbA1C: No results for input(s): HGBA1C in the last 72 hours. CBG: No results for input(s): GLUCAP in the last 168 hours. Lipid Profile: No results for input(s): CHOL, HDL, LDLCALC, TRIG, CHOLHDL, LDLDIRECT in the last 72 hours. Thyroid Function Tests: No results for input(s): TSH, T4TOTAL, FREET4, T3FREE, THYROIDAB in the last 72 hours. Anemia Panel: No results for input(s): VITAMINB12, FOLATE,  FERRITIN, TIBC, IRON, RETICCTPCT in the last 72 hours. Urine analysis:    Component Value Date/Time   COLORURINE YELLOW 11/14/2016 1110   APPEARANCEUR HAZY (A) 11/14/2016 1110   LABSPEC 1.017 11/14/2016 1110   PHURINE 5.0 11/14/2016 1110   GLUCOSEU NEGATIVE 11/14/2016 1110   HGBUR SMALL (A) 11/14/2016 1110   BILIRUBINUR NEGATIVE 11/14/2016 1110   KETONESUR NEGATIVE 11/14/2016 1110   PROTEINUR 30 (A) 11/14/2016 1110   UROBILINOGEN 0.2 06/11/2013 0159   NITRITE NEGATIVE 11/14/2016 1110   LEUKOCYTESUR LARGE (A) 11/14/2016 1110    Creatinine Clearance: Estimated Creatinine Clearance: 15.4 mL/min (A) (by C-G formula based on SCr of 3.32 mg/dL (H)).  Sepsis Labs: @LABRCNTIP (procalcitonin:4,lacticidven:4) ) Recent Results (from the past 240 hour(s))  Respiratory Panel by PCR     Status: None   Collection Time: 11/08/16  6:10 PM  Result Value Ref Range Status   Adenovirus NOT DETECTED NOT DETECTED Final   Coronavirus 229E NOT DETECTED NOT DETECTED Final   Coronavirus HKU1 NOT DETECTED NOT DETECTED Final   Coronavirus NL63 NOT DETECTED NOT DETECTED Final   Coronavirus OC43 NOT DETECTED NOT DETECTED Final   Metapneumovirus NOT DETECTED NOT DETECTED Final   Rhinovirus / Enterovirus NOT DETECTED NOT DETECTED Final   Influenza  A NOT DETECTED NOT DETECTED Final   Influenza B NOT DETECTED NOT DETECTED Final   Parainfluenza Virus 1 NOT DETECTED NOT DETECTED Final   Parainfluenza Virus 2 NOT DETECTED NOT DETECTED Final   Parainfluenza Virus 3 NOT DETECTED NOT DETECTED Final   Parainfluenza Virus 4 NOT DETECTED NOT DETECTED Final   Respiratory Syncytial Virus NOT DETECTED NOT DETECTED Final   Bordetella pertussis NOT DETECTED NOT DETECTED Final   Chlamydophila pneumoniae NOT DETECTED NOT DETECTED Final   Mycoplasma pneumoniae NOT DETECTED NOT DETECTED Final     Radiological Exams on Admission: Dg Chest 2 View  Result Date: 11/14/2016 CLINICAL DATA:  83 year old male with cough for few days. Subsequent encounter. EXAM: CHEST  2 VIEW COMPARISON:  11/08/2016 chest x-ray and chest CT. FINDINGS: Right lower lobe mass noted on recent CT not as well delineated by plain film exam. No pulmonary edema or pneumothorax. Heart size within normal limits. Calcified tortuous aorta. Mild loss of height midthoracic vertebra without change. IMPRESSION: Right lower lobe mass better delineated on recent CT. Findings suspicious for malignancy. Aortic Atherosclerosis (ICD10-I70.0). Electronically Signed   By: Genia Del M.D.   On: 11/14/2016 10:08   Ct Head Wo Contrast  Result Date: 11/14/2016 CLINICAL DATA:  Increased generalized weakness for 2 days. Fall this morning. EXAM: CT HEAD WITHOUT CONTRAST CT CERVICAL SPINE WITHOUT CONTRAST TECHNIQUE: Multidetector CT imaging of the head and cervical spine was performed following the standard protocol without intravenous contrast. Multiplanar CT image reconstructions of the cervical spine were also generated. COMPARISON:  None. FINDINGS: CT HEAD FINDINGS Brain: No evidence of acute infarction, hemorrhage, hydrocephalus, extra-axial collection or mass lesion/mass effect. Moderate age-related cerebral atrophy with compensatory dilatation of the ventricles. Periventricular white matter and corona  radiata hypodensities favor chronic ischemic microvascular white matter disease. Vascular: Carotid and vertebral atherosclerotic calcification. No hyperdense vessel. Skull: Normal. Negative for fracture or focal lesion. Sinuses/Orbits: Minimal mucosal thickening in the left maxillary sinus. The remaining bilateral paranasal sinuses and mastoid air cells are clear. Bilateral pseudophakia. Other: None. CT CERVICAL SPINE FINDINGS Alignment: Trace anterolisthesis of C4 on C5 due to facet arthropathy. Skull base and vertebrae: No acute fracture. No primary bone lesion or focal pathologic process. Soft tissues  and spinal canal: No prevertebral fluid or swelling. No visible canal hematoma. Atherosclerotic vascular calcifications of the bilateral carotid bulbs. Disc levels: Increased mineralization posterior to the dens, which can be age-related or seen with CPPD. Advanced degenerative disc disease and facet uncovertebral hypertrophy from C3-C4 through C6-C7, resulting in moderate left neuroforaminal stenosis at C3-C4, C4-C5, and C5-C6. Upper chest: Biapical pleuroparenchymal scarring and paraseptal emphysema. Atherosclerotic calcification of the aortic arch. Other: None. IMPRESSION: 1.  No acute intracranial abnormality. 2.  No acute cervical spine fracture. 3.  Aortic Atherosclerosis (ICD10-I70.0). 4.  Emphysema. ([id number]) Electronically Signed   By: Titus Dubin M.D.   On: 11/14/2016 10:23   Ct Cervical Spine Wo Contrast  Result Date: 11/14/2016 CLINICAL DATA:  Increased generalized weakness for 2 days. Fall this morning. EXAM: CT HEAD WITHOUT CONTRAST CT CERVICAL SPINE WITHOUT CONTRAST TECHNIQUE: Multidetector CT imaging of the head and cervical spine was performed following the standard protocol without intravenous contrast. Multiplanar CT image reconstructions of the cervical spine were also generated. COMPARISON:  None. FINDINGS: CT HEAD FINDINGS Brain: No evidence of acute infarction, hemorrhage,  hydrocephalus, extra-axial collection or mass lesion/mass effect. Moderate age-related cerebral atrophy with compensatory dilatation of the ventricles. Periventricular white matter and corona radiata hypodensities favor chronic ischemic microvascular white matter disease. Vascular: Carotid and vertebral atherosclerotic calcification. No hyperdense vessel. Skull: Normal. Negative for fracture or focal lesion. Sinuses/Orbits: Minimal mucosal thickening in the left maxillary sinus. The remaining bilateral paranasal sinuses and mastoid air cells are clear. Bilateral pseudophakia. Other: None. CT CERVICAL SPINE FINDINGS Alignment: Trace anterolisthesis of C4 on C5 due to facet arthropathy. Skull base and vertebrae: No acute fracture. No primary bone lesion or focal pathologic process. Soft tissues and spinal canal: No prevertebral fluid or swelling. No visible canal hematoma. Atherosclerotic vascular calcifications of the bilateral carotid bulbs. Disc levels: Increased mineralization posterior to the dens, which can be age-related or seen with CPPD. Advanced degenerative disc disease and facet uncovertebral hypertrophy from C3-C4 through C6-C7, resulting in moderate left neuroforaminal stenosis at C3-C4, C4-C5, and C5-C6. Upper chest: Biapical pleuroparenchymal scarring and paraseptal emphysema. Atherosclerotic calcification of the aortic arch. Other: None. IMPRESSION: 1.  No acute intracranial abnormality. 2.  No acute cervical spine fracture. 3.  Aortic Atherosclerosis (ICD10-I70.0). 4.  Emphysema. ([id number]) Electronically Signed   By: Titus Dubin M.D.   On: 11/14/2016 10:23    EKG: Independently reviewed. Atrial fibrillation RBBB and LPFB Consider left ventricular hypertrophy Repol abnrm suggests ischemia, diffuse leads similar to previous  Assessment/Plan Principal Problem:   UTI (urinary tract infection) Active Problems:   History of prostate cancer   Atrial fibrillation ([facility])   Mass of right  lung   CKD (chronic kidney disease) stage 4, GFR 15-29 ml/min ([facility])   COPD (chronic obstructive pulmonary disease) ([facility])   Generalized weakness   Elevated troponin   #1. Urinary tract infection. Patient with a history of same for last 2 years according to the wife. He is afebrile no leukocytosis hemodynamically stable. He is provided with Rocephin in the emergency department -Admit to MedSurg -Continue Rocephin -follow urine culture -Monitor urine output -Gentle IV fluids  #2. Chronic kidney disease stage IV. Creatinine 3.3 on admission. This appears to be close to his baseline. -Hold nephrotoxins -Monitor urine output -Recheck in the morning  #3. Atrial fibrillation/elevated troponin. Currently rate controlled. Not on anticoagulation. Home medications include weight control meds. Initial troponin 0.16. This is up from last week. No reports of chest pain. EKG without acute  changes. Not a candidate for intervention. -We will obtain 1 more troponin to check trend for evaluation purposes  #4. COPD on home oxygen. Patient recently hospitalized with acute respiratory failure presumably from a COPD exacerbation. Has not had an official diagnosis. Chest x-ray right lower lobe mass similar to last image. Oxygen saturation level greater than 90% on 4 L. At discharge 4 days ago he was on home oxygen -Scheduled nebulizers -Continue oxygen -Monitor oxygen saturation level  #5. Massive right lung mass. Patient has deferred formal workup/biopsy. Today's imaging as noted above -Palliative care consult  #6. Generalized weakness/fall. Likely related to all the above. Infectious process in the setting of likely progressive disease. I discussed palliative care with wife who is open to discussion and seems aware of current situation. She verbalizes concern over being able to care for him herself -Physical therapy -Palliative care consult   DVT prophylaxis: heparin  Code Status: DNR  Family  Communication: wife at bedside  Disposition Plan: to be determined  Consults called: palliative care  Admission status: obs    Radene Gunning MD [facility]  If 7PM-7AM, please contact night-coverage [email or web address] Password [email or web address]  11/14/2016, 1:22 PM

## 2016-11-14 NOTE — ED Notes (Signed)
Pt is using bed pan at this time.

## 2016-11-14 NOTE — ED Triage Notes (Signed)
Pt. Coming from home via [facility] for generalized weakness and not getting around like normal per wife. Pt. Has been up all night. Pt. C/o CP when coughing. Pt. Aox4. EMS reports abnormal EKG that showed generalized ischemia. Pt. On 4L oxygen at home for COPD. Pt. Did have fall this morning, but denies hitting head or LOC. EMS reports lung sounds clear. Pt. Wife called EMS this morning due to patient unable to put pants on.

## 2016-11-14 NOTE — Care Management Obs Status (Signed)
[place] NOTIFICATION   Patient Details  Name: Derrick [name] MRN: [id number] Date of Birth: [birth year]   Medicare Observation Status Notification Given:  Yes    Corky Crafts, RN 11/14/2016, 3:32 PM

## 2016-11-14 NOTE — ED Provider Notes (Signed)
[facility] DEPT Provider Note   CSN: [id number] Arrival date & time: 11/14/16  2841     History   Chief Complaint Chief Complaint  Patient presents with  . Weakness    HPI Derrick [name] is a 83 y.o. male.  The history is provided by the patient.  Weakness  Primary symptoms include loss of balance.  Primary symptoms include no focal weakness, no loss of sensation, no speech change. This is a new problem. The current episode started more than 2 days ago. The problem has been gradually worsening. There was no focality noted. There has been no fever. Associated symptoms include confusion. Pertinent negatives include no shortness of breath and no chest pain. There were no medications administered prior to arrival.   83 year old male who presents with generalized weakness. He has a history of atrial fibrillation, not anticoagulated, emphysema, and previous history of DVT. Patient just admitted to the hospital and discharged 11/09/2016 for COPD/emphysema exacerbation. He was discharged with 3 L home oxygen. His wife states that he initially looked well. 4 days ago began to be very slow in terms of speaking and activity. This morning had difficulty ambulating to the bathroom and had a mechanical fall. He fell on the buttocks, but did hit his head on the edge of the sink. Denies any syncope or loss of consciousness. He denies any chest pain or difficulty breathing. No known fevers. Reports a persistent dry cough. No leg swelling or leg pains. No dysuria or urinary frequency. No abdominal pain.  Past Medical History:  Diagnosis Date  . Atrial fibrillation ([place])    In the context of urosepsis  . Emphysema lung ([place])   . Prostate cancer ([place])   . Urosepsis     Patient Active Problem List   Diagnosis Date Noted  . Atrial fibrillation ([place]) 11/08/2016  . Emphysema lung ([place]) 11/08/2016  . Mass of right lung 11/08/2016  . Acute respiratory failure with hypoxia ([place]) 11/08/2016  . CKD  (chronic kidney disease) stage 4, GFR 15-29 ml/min (HCC) 11/08/2016  . Left ventricular diastolic dysfunction, NYHA class 2 11/08/2016  . Thrombocytopenia, idiopathic ([place]) 11/08/2016  . COPD exacerbation ([place]) 11/08/2016  . Protein calorie malnutrition ([place]) 11/08/2016  . Suspected cancer of lower lobe of right lung ([place]) 02/11/2015  . Sepsis ([place]) 06/15/2013  . Atrial fibrillation with RVR ([place]) 06/13/2013  . Fever 06/11/2013  . Pyelonephritis, acute 06/11/2013  . Community acquired pneumonia 06/11/2013  . Acute encephalopathy 06/11/2013  . Renal failure, acute on chronic (HCC) 06/11/2013  . Thrombocytopenia ([place]) 06/11/2013  . History of prostate cancer 11/13/2011  . Diverticulosis 11/13/2011    Past Surgical History:  Procedure Laterality Date  . ABDOMINAL AORTIC ANEURYSM REPAIR  2001  . COLECTOMY  2005   diverticular rupture  . colon reastomosis  2005  . PROSTATECTOMY  2001   Dr Risa Grill       Home Medications    Prior to Admission medications   Medication Sig Start Date End Date Taking? Authorizing Provider  aspirin 81 MG tablet Take 81 mg by mouth daily.   Yes [provider]  cholecalciferol (VITAMIN D) 1000 units tablet Take 1,000 Units by mouth daily.   Yes [provider]  feeding supplement, ENSURE ENLIVE, (ENSURE ENLIVE) LIQD Take 237 mLs by mouth 2 (two) times daily between meals. 11/09/16  Yes Mariel Aloe, MD  Multiple Vitamin (MULTIVITAMIN) tablet Take 1 tablet by mouth daily.   Yes [provider]  pravastatin (PRAVACHOL)  20 MG tablet Take 20 mg by mouth daily.   Yes [provider]  Probiotic Product (ALIGN) 4 MG CAPS Take 4 mg by mouth daily.   Yes [provider]  ranitidine (ZANTAC) 150 MG tablet Take 150 mg by mouth daily.   Yes [provider]  vitamin B-12 (CYANOCOBALAMIN) 1000 MCG tablet Take 1,000 mcg by mouth daily.   Yes [provider]  ipratropium-albuterol (DUONEB) 0.5-2.5 (3)  MG/3ML SOLN Take 3 mLs by nebulization every 6 (six) hours as needed. Patient not taking: Reported on 11/14/2016 11/09/16   Mariel Aloe, MD    Family History Family History  Problem Relation Age of Onset  . Heart disease Mother   . Stroke Father   . Alzheimer's disease Sister   . Parkinson's disease Brother     Social History Social History  Substance Use Topics  . Smoking status: Former Smoker    Packs/day: 1.00    Quit date: 04/18/1999  . Smokeless tobacco: Never Used  . Alcohol use Yes     Comment: Beer-seldom      Allergies   Patient has no known allergies.   Review of Systems Review of Systems  Constitutional: Negative for fever.  Respiratory: Negative for shortness of breath.   Cardiovascular: Negative for chest pain.  Gastrointestinal: Negative for abdominal pain and diarrhea.  Genitourinary: Negative for dysuria and frequency.  Neurological: Positive for weakness and loss of balance. Negative for speech change and focal weakness.  Psychiatric/Behavioral: Positive for confusion.  All other systems reviewed and are negative.    Physical Exam Updated Vital Signs BP (!) 146/89   Pulse 93   Temp 97.7 F (36.5 C) (Oral)   Resp (!) 27   Ht 6\' 2"  (1.88 m)   Wt 68 kg (150 lb)   SpO2 98%   BMI 19.26 kg/m   Physical Exam Physical Exam  Nursing note and vitals reviewed. Constitutional: Well developed elderly man, appears tired but, non-toxic, and in no acute distress Head: Normocephalic. Small bruise to the left corner of the left eye  Mouth/Throat: Oropharynx is clear and moist.  Neck: Normal range of motion. Neck supple. no cervical spine tenderness. Cardiovascular: Normal rate and irregularly irregular rhythm.  no edema Pulmonary/Chest: Effort normal and breath sounds with crackles at the mid to low lung bilaterally.  Abdominal: Soft. There is no tenderness. There is no rebound and no guarding.  Musculoskeletal: No deformity Neurological: Alert, no  facial droop, fluent speech, moves all extremities symmetrically Skin: Skin is warm and dry.  Psychiatric: Cooperative   ED Treatments / Results  Labs (all labs ordered are listed, but only abnormal results are displayed) Labs Reviewed  CBC WITH DIFFERENTIAL/PLATELET - Abnormal; Notable for the following:       Result Value   RBC 3.47 (*)    Hemoglobin 10.7 (*)    HCT 33.8 (*)    Platelets 131 (*)    Lymphs Abs 0.4 (*)    All other components within normal limits  COMPREHENSIVE METABOLIC PANEL - Abnormal; Notable for the following:    Glucose, Bld 107 (*)    BUN 82 (*)    Creatinine, Ser 3.32 (*)    Total Protein 6.2 (*)    Albumin 3.2 (*)    GFR calc non Af Amer 15 (*)    GFR calc Af Amer 18 (*)    All other components within normal limits  URINALYSIS, ROUTINE W REFLEX MICROSCOPIC - Abnormal; Notable for the  following:    APPearance HAZY (*)    Hgb urine dipstick SMALL (*)    Protein, ur 30 (*)    Leukocytes, UA LARGE (*)    Bacteria, UA FEW (*)    Squamous Epithelial / LPF 0-5 (*)    All other components within normal limits  I-STAT TROPONIN, ED - Abnormal; Notable for the following:    Troponin i, poc 0.16 (*)    All other components within normal limits  URINE CULTURE  I-STAT VENOUS BLOOD GAS, ED    EKG  EKG Interpretation  Date/Time:  Tuesday November 14 2016 08:41:05 EDT Ventricular Rate:  104 PR Interval:    QRS Duration: 138 QT Interval:  373 QTC Calculation: 491 R Axis:   107 Text Interpretation:  Atrial fibrillation RBBB and LPFB Consider left ventricular hypertrophy Repol abnrm suggests ischemia, diffuse leads similar to previous EKG Confirmed by Brantley Stage [phone]) on 11/14/2016 8:48:11 AM       Radiology Dg Chest 2 View  Result Date: 11/14/2016 CLINICAL DATA:  83 year old male with cough for few days. Subsequent encounter. EXAM: CHEST  2 VIEW COMPARISON:  11/08/2016 chest x-ray and chest CT. FINDINGS: Right lower lobe mass noted on recent CT not as  well delineated by plain film exam. No pulmonary edema or pneumothorax. Heart size within normal limits. Calcified tortuous aorta. Mild loss of height midthoracic vertebra without change. IMPRESSION: Right lower lobe mass better delineated on recent CT. Findings suspicious for malignancy. Aortic Atherosclerosis (ICD10-I70.0). Electronically Signed   By: Genia Del M.D.   On: 11/14/2016 10:08   Ct Head Wo Contrast  Result Date: 11/14/2016 CLINICAL DATA:  Increased generalized weakness for 2 days. Fall this morning. EXAM: CT HEAD WITHOUT CONTRAST CT CERVICAL SPINE WITHOUT CONTRAST TECHNIQUE: Multidetector CT imaging of the head and cervical spine was performed following the standard protocol without intravenous contrast. Multiplanar CT image reconstructions of the cervical spine were also generated. COMPARISON:  None. FINDINGS: CT HEAD FINDINGS Brain: No evidence of acute infarction, hemorrhage, hydrocephalus, extra-axial collection or mass lesion/mass effect. Moderate age-related cerebral atrophy with compensatory dilatation of the ventricles. Periventricular white matter and corona radiata hypodensities favor chronic ischemic microvascular white matter disease. Vascular: Carotid and vertebral atherosclerotic calcification. No hyperdense vessel. Skull: Normal. Negative for fracture or focal lesion. Sinuses/Orbits: Minimal mucosal thickening in the left maxillary sinus. The remaining bilateral paranasal sinuses and mastoid air cells are clear. Bilateral pseudophakia. Other: None. CT CERVICAL SPINE FINDINGS Alignment: Trace anterolisthesis of C4 on C5 due to facet arthropathy. Skull base and vertebrae: No acute fracture. No primary bone lesion or focal pathologic process. Soft tissues and spinal canal: No prevertebral fluid or swelling. No visible canal hematoma. Atherosclerotic vascular calcifications of the bilateral carotid bulbs. Disc levels: Increased mineralization posterior to the dens, which can be  age-related or seen with CPPD. Advanced degenerative disc disease and facet uncovertebral hypertrophy from C3-C4 through C6-C7, resulting in moderate left neuroforaminal stenosis at C3-C4, C4-C5, and C5-C6. Upper chest: Biapical pleuroparenchymal scarring and paraseptal emphysema. Atherosclerotic calcification of the aortic arch. Other: None. IMPRESSION: 1.  No acute intracranial abnormality. 2.  No acute cervical spine fracture. 3.  Aortic Atherosclerosis (ICD10-I70.0). 4.  Emphysema. ([id number]-F62.9) Electronically Signed   By: Titus Dubin M.D.   On: 11/14/2016 10:23   Ct Cervical Spine Wo Contrast  Result Date: 11/14/2016 CLINICAL DATA:  Increased generalized weakness for 2 days. Fall this morning. EXAM: CT HEAD WITHOUT CONTRAST CT CERVICAL SPINE WITHOUT CONTRAST TECHNIQUE: Multidetector CT  imaging of the head and cervical spine was performed following the standard protocol without intravenous contrast. Multiplanar CT image reconstructions of the cervical spine were also generated. COMPARISON:  None. FINDINGS: CT HEAD FINDINGS Brain: No evidence of acute infarction, hemorrhage, hydrocephalus, extra-axial collection or mass lesion/mass effect. Moderate age-related cerebral atrophy with compensatory dilatation of the ventricles. Periventricular white matter and corona radiata hypodensities favor chronic ischemic microvascular white matter disease. Vascular: Carotid and vertebral atherosclerotic calcification. No hyperdense vessel. Skull: Normal. Negative for fracture or focal lesion. Sinuses/Orbits: Minimal mucosal thickening in the left maxillary sinus. The remaining bilateral paranasal sinuses and mastoid air cells are clear. Bilateral pseudophakia. Other: None. CT CERVICAL SPINE FINDINGS Alignment: Trace anterolisthesis of C4 on C5 due to facet arthropathy. Skull base and vertebrae: No acute fracture. No primary bone lesion or focal pathologic process. Soft tissues and spinal canal: No prevertebral fluid or  swelling. No visible canal hematoma. Atherosclerotic vascular calcifications of the bilateral carotid bulbs. Disc levels: Increased mineralization posterior to the dens, which can be age-related or seen with CPPD. Advanced degenerative disc disease and facet uncovertebral hypertrophy from C3-C4 through C6-C7, resulting in moderate left neuroforaminal stenosis at C3-C4, C4-C5, and C5-C6. Upper chest: Biapical pleuroparenchymal scarring and paraseptal emphysema. Atherosclerotic calcification of the aortic arch. Other: None. IMPRESSION: 1.  No acute intracranial abnormality. 2.  No acute cervical spine fracture. 3.  Aortic Atherosclerosis (ICD10-I70.0). 4.  Emphysema. ([id number]-U04.9) Electronically Signed   By: Titus Dubin M.D.   On: 11/14/2016 10:23    Procedures Procedures (including critical care time)  Medications Ordered in ED Medications  cefTRIAXone (ROCEPHIN) 1 g in dextrose 5 % 50 mL IVPB (1 g Intravenous New Bag/Given 11/14/16 1227)  ipratropium-albuterol (DUONEB) 0.5-2.5 (3) MG/3ML nebulizer solution 3 mL (3 mLs Nebulization Given 11/14/16 1046)  aspirin chewable tablet 324 mg (324 mg Oral Given 11/14/16 1226)     Initial Impression / Assessment and Plan / ED Course  I have reviewed the triage vital signs and the nursing notes.  Pertinent labs & imaging results that were available during my care of the patient were reviewed by me and considered in my medical decision making (see chart for details).     Just discharged after copd exacerbation with oxygen, presenting with generalized weakness and fatigue. EKG shows afib with ST depressions inferolaterally but this is unchanged from previous ekg on 7/25. He does have elevated troponin 0.16 but no chest pain or dyspnea. Work-up also suggest UTI. Given ceftriaxone. Ct head and neck negative and without injury from his fall and headstrike. cxr without pneumonia or other acute cardiopulmonary processes. Patient has elected no work-up for chest  pain and goals of care conversation was initiated during last hospitalization. Unclear if elevated trop may be NSTEMI versus demand from infection. Unclear if he would be a good cath candidate. Discussed with Dyanne Carrel from hospitalist service, who will admit for ongoing management.   Final Clinical Impressions(s) / ED Diagnoses   Final diagnoses:  Lower urinary tract infectious disease  Elevated troponin    New Prescriptions New Prescriptions   No medications on file     Forde Dandy, MD 11/14/16 1235

## 2016-11-14 NOTE — ED Notes (Signed)
Got patient undress on the monitor did ekg shown to er doctor

## 2016-11-14 NOTE — Progress Notes (Signed)
Admission note:  Arrival Method: via stretcher accompanied by EMT and pt wife Mental Orientation: Alert and oriented x2 Assessment: see doc flow sheet Skin: warm, dry and intact. IV: R AC with NS going at 21ml/hr. Pain: denies Safety Measures: discussed and reviewed with pt and wife at bedside Fall Prevention Safety Plan: low bed with fall mats in place, side rails up x4, call bell and belongings within reach. Admission Screening: in progress 6700 Orientation: Patient has been oriented to the unit, staff and to the room.

## 2016-11-14 NOTE — ED Notes (Signed)
Report attempted x 1

## 2016-11-14 NOTE — ED Notes (Signed)
RN assisted patient to stand and urinate.

## 2016-11-15 DIAGNOSIS — Z515 Encounter for palliative care: Secondary | ICD-10-CM

## 2016-11-15 DIAGNOSIS — N184 Chronic kidney disease, stage 4 (severe): Secondary | ICD-10-CM | POA: Diagnosis not present

## 2016-11-15 DIAGNOSIS — Z66 Do not resuscitate: Secondary | ICD-10-CM | POA: Diagnosis not present

## 2016-11-15 DIAGNOSIS — N10 Acute tubulo-interstitial nephritis: Secondary | ICD-10-CM | POA: Diagnosis not present

## 2016-11-15 DIAGNOSIS — R531 Weakness: Secondary | ICD-10-CM | POA: Diagnosis not present

## 2016-11-15 DIAGNOSIS — N3001 Acute cystitis with hematuria: Secondary | ICD-10-CM | POA: Diagnosis not present

## 2016-11-15 DIAGNOSIS — I481 Persistent atrial fibrillation: Secondary | ICD-10-CM

## 2016-11-15 DIAGNOSIS — J410 Simple chronic bronchitis: Secondary | ICD-10-CM

## 2016-11-15 DIAGNOSIS — N390 Urinary tract infection, site not specified: Principal | ICD-10-CM

## 2016-11-15 DIAGNOSIS — R627 Adult failure to thrive: Secondary | ICD-10-CM

## 2016-11-15 DIAGNOSIS — R748 Abnormal levels of other serum enzymes: Secondary | ICD-10-CM | POA: Diagnosis not present

## 2016-11-15 LAB — BASIC METABOLIC PANEL
Anion gap: 11 (ref 5–15)
BUN: 75 mg/dL — AB (ref 6–20)
CALCIUM: 9.5 mg/dL (ref 8.9–10.3)
CO2: 23 mmol/L (ref 22–32)
CREATININE: 3.26 mg/dL — AB (ref 0.61–1.24)
Chloride: 107 mmol/L (ref 101–111)
GFR calc Af Amer: 18 mL/min — ABNORMAL LOW (ref >60)
GFR, EST NON AFRICAN AMERICAN: 16 mL/min — AB (ref >60)
Glucose, Bld: 135 mg/dL — ABNORMAL HIGH (ref 65–99)
POTASSIUM: 4.6 mmol/L (ref 3.5–5.1)
SODIUM: 141 mmol/L (ref 135–145)

## 2016-11-15 LAB — CBC
HCT: 34.3 % — ABNORMAL LOW (ref 39.0–52.0)
Hemoglobin: 11.1 g/dL — ABNORMAL LOW (ref 13.0–17.0)
MCH: 31.6 pg (ref 26.0–34.0)
MCHC: 32.4 g/dL (ref 30.0–36.0)
MCV: 97.7 fL (ref 78.0–100.0)
PLATELETS: 141 10*3/uL — AB (ref 150–400)
RBC: 3.51 MIL/uL — ABNORMAL LOW (ref 4.22–5.81)
RDW: 13.1 % (ref 11.5–15.5)
WBC: 6.5 10*3/uL (ref 4.0–10.5)

## 2016-11-15 LAB — URINE CULTURE

## 2016-11-15 MED ORDER — MORPHINE SULFATE 10 MG/5ML PO SOLN
5.0000 mg | ORAL | Status: DC | PRN
  Administered 2016-11-16 – 2016-11-17 (×4): 5 mg via ORAL
  Filled 2016-11-15 (×4): qty 4

## 2016-11-15 MED ORDER — CEFDINIR 125 MG/5ML PO SUSR
250.0000 mg | Freq: Two times a day (BID) | ORAL | Status: DC
  Administered 2016-11-15 – 2016-11-16 (×4): 250 mg via ORAL
  Filled 2016-11-15 (×5): qty 10

## 2016-11-15 MED ORDER — IPRATROPIUM-ALBUTEROL 0.5-2.5 (3) MG/3ML IN SOLN
3.0000 mL | Freq: Three times a day (TID) | RESPIRATORY_TRACT | Status: DC
  Administered 2016-11-16 – 2016-11-17 (×4): 3 mL via RESPIRATORY_TRACT
  Filled 2016-11-15 (×4): qty 3

## 2016-11-15 NOTE — Consult Note (Signed)
Consultation Note Date: 11/15/2016   Patient Name: Joseh Sjogren Garramone  DOB: 1928-10-18  MRN: [id number]  Age / Sex: 83 y.o., male  PCP: Crist Infante, MD Referring Physician: Reyne Dumas, MD  Reason for Consultation: Disposition and Establishing goals of care  HPI/Patient Profile: Domani Bakos Asay is a 84 y.o. male with medical history significant for COPD just recently discharged from hospital for exacerbation on home O2, atrial fibrillation not on anticoagulation, known spiculated right lung mass suspected to be malignancy for which patient has deferred additional evaluation, stage IV chronic kidney disease, chronic idiopathic thrombocytopenia and prostate cancer presents emergency Department with  chief complaint generalized weakness with recent fall. Initial evaluation reveals a urinary tract infection and an elevated troponin.  Information is obtained from the wife who is at the bedside. She states 4 days ago patient was discharged with home oxygen. The first day he "did well". With each passing day he became a little more week eating and drinking a little last having more difficulty ambulating to the bathroom. Associated symptoms include worsening cough/chest congestion at night to the point that "no one in the house could sleep". She denies any productive cough, fever complaints of chills headache dizziness. No nausea vomiting diarrhea. Patient denies dysuria hematuria frequency or urgency. She states that respiratory effort improved immediately once he received nebulizer emergency department.  Patient has had continued physical and functional decline over the past few months. Patient and family face treatment option decisions, advanced directive decisions, and anticipatory care needs.   Clinical Assessment and Goals of Care:  Patient resting in bed, noted to awaken and quickly complaining of abdominal pain,  and going back to sleep.  This NP Asencion Gowda, along with Wadie Lessen NP  reviewed medical records, received report from team, assessed the patient, and then meet at the patient's bedside along with his wife, son, and daughter to discuss diagnosis, prognosis, GOC, EOL wishes disposition and options.  A detailed discussion was had today regarding advanced directives.  Concepts specific to code status, artifical feeding and hydration, continued IV antibiotics and rehospitalization was had. The difference between a aggressive medical intervention path  and a palliative comfort care path for this patient at this time was had.  Values and goals of care important to patient and family were discussed. Concept of Hospice and Palliative Care were discussed Natural trajectory and expectations at EOL were discussed.  Questions and concerns addressed.   Family encouraged to call with questions or concerns.  PMT will continue to support holistically.  Family states they have had conversations with patient regarding his wishes at end of life, comfort quality and dignity have always been a priority. Course as directed from conversation include: DNR/DNI Deescalation of IV antibiotics to oral antibiotics a tolerated by patient No feeding tube or IV fluid Sips of fluid and food for comfort.  No dialysis.   Will see patient tomorrow to fill out MOST form and evaluate for transition of care.   Wife is present and is present to  make decisions. Son and daughter present.      SUMMARY OF RECOMMENDATIONS    Code Status/Advance Care Planning:  DNR- Comfort Care    Symptom Management:   Morphine for pain  Bladder scan. Foley for retention.   Palliative Prophylaxis:   Aspiration and Frequent Pain Assessment  Additional Recommendations (Limitations, Scope, Preferences):  Avoid Hospitalization, Full Comfort Care, Initiate Comfort Feeding, No Artificial Feeding, No Hemodialysis, No IV Antibiotics, No  IV Fluids and No Lab Draws  Psycho-social/Spiritual:   Patient lives with wife at home.   Prognosis:   Unable to determine Will reevaluate tomorrow.   Discharge Planning: To Be Determined      Primary Diagnoses: Present on Admission: . Atrial fibrillation ([place]) . CKD (chronic kidney disease) stage 4, GFR 15-29 ml/min (HCC) . Mass of right lung . UTI (urinary tract infection) . Elevated troponin . COPD (chronic obstructive pulmonary disease) ([place])   I have reviewed the medical record, interviewed the patient and family, and examined the patient. The following aspects are pertinent.  Past Medical History:  Diagnosis Date  . Atrial fibrillation ([place])    In the context of urosepsis  . Emphysema lung ([place])   . Prostate cancer ([place])   . Urosepsis    Social History   Social History  . Marital status: Married    Spouse name: N/A  . Number of children: N/A  . Years of education: N/A   Social History Main Topics  . Smoking status: Former Smoker    Packs/day: 1.00    Quit date: 04/18/1999  . Smokeless tobacco: Never Used  . Alcohol use Yes     Comment: Beer-seldom   . Drug use: No  . Sexual activity: No   Other Topics Concern  . None   Social History Narrative  . None   Family History  Problem Relation Age of Onset  . Heart disease Mother   . Stroke Father   . Alzheimer's disease Sister   . Parkinson's disease Brother    Scheduled Meds: . aspirin EC  81 mg Oral Daily  . cefdinir  250 mg Oral BID  . famotidine  20 mg Oral Daily  . feeding supplement (ENSURE ENLIVE)  237 mL Oral BID BM  . heparin  5,000 Units Subcutaneous Q8H  . ipratropium-albuterol  3 mL Nebulization Q6H  . pravastatin  20 mg Oral Daily  . predniSONE  20 mg Oral Q breakfast  . vitamin B-12  1,000 mcg Oral Daily   Continuous Infusions: PRN Meds:.acetaminophen **OR** acetaminophen, HYDROcodone-acetaminophen, ondansetron **OR** ondansetron (ZOFRAN) IV, senna-docusate,  traZODone Medications Prior to Admission:  Prior to Admission medications   Medication Sig Start Date End Date Taking? Authorizing Provider  aspirin 81 MG tablet Take 81 mg by mouth daily.   Yes [provider]  cholecalciferol (VITAMIN D) 1000 units tablet Take 1,000 Units by mouth daily.   Yes [provider]  feeding supplement, ENSURE ENLIVE, (ENSURE ENLIVE) LIQD Take 237 mLs by mouth 2 (two) times daily between meals. 11/09/16  Yes Mariel Aloe, MD  Multiple Vitamin (MULTIVITAMIN) tablet Take 1 tablet by mouth daily.   Yes [provider]  pravastatin (PRAVACHOL) 20 MG tablet Take 20 mg by mouth daily.   Yes [provider]  Probiotic Product (ALIGN) 4 MG CAPS Take 4 mg by mouth daily.   Yes [provider]  ranitidine (ZANTAC) 150 MG tablet Take 150 mg by mouth daily.   Yes [provider]  vitamin  B-12 (CYANOCOBALAMIN) 1000 MCG tablet Take 1,000 mcg by mouth daily.   Yes [provider]   No Known Allergies Review of Systems  Constitutional: Positive for appetite change and fatigue.  Gastrointestinal: Positive for abdominal pain.    Physical Exam  Constitutional:  Sleepy  HENT:  Head: Normocephalic and atraumatic.  Cardiovascular:  Warm and dry  Pulmonary/Chest:  Tachypnic  Abdominal: He exhibits no distension.  Neurological:  Sleepy, but awakens to voice.  Skin: Skin is warm and dry.    Vital Signs: BP 134/83 (BP Location: Right Arm)   Pulse 98   Temp 98.2 F (36.8 C) (Oral)   Resp 18   Ht 6\' 2"  (1.88 m)   Wt 73.9 kg (163 lb)   SpO2 98%   BMI 20.93 kg/m  Pain Assessment: No/denies pain   Pain Score: 0-No pain   SpO2: SpO2: 98 % O2 Device:SpO2: 98 % O2 Flow Rate: .O2 Flow Rate (L/min): 3 L/min  IO: Intake/output summary:  Intake/Output Summary (Last 24 hours) at 11/15/16 1405 Last data filed at 11/15/16 0825  Gross per 24 hour  Intake           488.33 ml  Output                2 ml  Net            486.33 ml    LBM: Last BM Date: 11/14/16 Baseline Weight: Weight: 68 kg (150 lb) Most recent weight: Weight: 73.9 kg (163 lb)     Palliative Assessment/Data: 30% at best   Discussed with Dr Allyson Sabal  Time In: 13:00 Time Out: 14:15 Time Total: 75 minutes Greater than 50%  of this time was spent counseling and coordinating care related to the above assessment and plan.  Signed by: Asencion Gowda, NP    Wadie Lessen NP   Please contact Palliative Medicine Team phone at [phone] for questions and concerns.  For individual provider: See Shea Evans

## 2016-11-15 NOTE — Progress Notes (Signed)
[facility] PROGRESS NOTE  Derrick [name] KVQ:[id number] DOB: 09/23/1928 DOA: 11/14/2016   PCP: Crist Infante, MD     Assessment/Plan: Principal Problem:   UTI (urinary tract infection) Active Problems:   History of prostate cancer   Atrial fibrillation ([place])   Mass of right lung   CKD (chronic kidney disease) stage 4, GFR 15-29 ml/min (HCC)   COPD (chronic obstructive pulmonary disease) (HCC)   Generalized weakness   Elevated troponin     83 y.o. male medical history significant for COPD just recently discharged from hospital for exacerbation on home O2, atrial fibrillation not on anticoagulation, known spiculated right lung mass suspected to be malignancy for which patient has deferred additional evaluation, stage IV chronic kidney disease, chronic idiopathic thrombocytopenia and prostate cancer presents emergency Department chief complaint generalized weakness with recent fall, general deconditioning and gradual approach to end of life. Pt w/ massive lung mass that is likely cancer. Pt w/ multiple recent admission and this is likely to continue. Palliative care consult greatly appreciated for [place] discussions.   Assessment and plan #1. Urinary tract infection.   Change rocephin to omnicef PO Follow urine culture    #2. Chronic kidney disease stage IV. Creatinine 3.3> 3.26  on admission. This appears to be close to his baseline. -Hold nephrotoxins -Monitor urine output    #3. Atrial fibrillation/elevated troponin. Currently rate controlled. Not on anticoagulation. Home medications include weight control meds. Initial troponin 0.16, now 0.58.  No reports of chest pain. EKG without acute changes. Not a candidate for intervention.    #4. COPD on home oxygen. Patient recently hospitalized with acute respiratory failure presumably from a COPD exacerbation. Has not had an official diagnosis. Chest x-ray right lower lobe mass similar to last image. Oxygen requirements  better overnight, down from 5-3 L of oxygen,-Scheduled nebulizers    #5. Massive right lung mass. Patient has deferred formal workup/biopsy. Today's imaging as noted above -Palliative care consult  #6. Generalized weakness/fall. Likely related to all the above. Infectious process in the setting of likely progressive disease. I discussed palliative care with wife who is open to discussion and seems aware of current situation. She verbalizes concern over being able to care for him herself. Goals of care discussion pending     DVT prophylaxsis heparin  Code Status:  DO NOT RESUSCITATE    Family Communication: Discussed in detail with the patient, all imaging results, lab results explained to the patient   Disposition Plan:  Palliative care consult for goc, family agreeable to comfort care measures and open to [facility] place placement       Consultants:  Palliative care  Procedures:  None  Antibiotics: Anti-infectives    Start     Dose/Rate Route Frequency Ordered Stop   11/15/16 1230  cefTRIAXone (ROCEPHIN) 1 g in dextrose 5 % 50 mL IVPB     1 g 100 mL/hr over 30 Minutes Intravenous Every 24 hours 11/14/16 1243     11/14/16 1215  cefTRIAXone (ROCEPHIN) 1 g in dextrose 5 % 50 mL IVPB     1 g 100 mL/hr over 30 Minutes Intravenous  Once 11/14/16 1206 11/14/16 1337         HPI/Subjective: Somnolent, complaining of abdominal pain  Objective: Vitals:   11/14/16 2136 11/15/16 0345 11/15/16 0426 11/15/16 1000  BP:   (!) 141/90 134/83  Pulse:   (!) 101 98  Resp:   20 18  Temp:   98.4 F (  36.9 C) 98.2 F (36.8 C)  TempSrc:    Oral  SpO2: 94% 94% 97% 98%  Weight:      Height:        Intake/Output Summary (Last 24 hours) at 11/15/16 1252 Last data filed at 11/15/16 0825  Gross per 24 hour  Intake           538.33 ml  Output                2 ml  Net           536.33 ml    Exam:  Examination:  General exam: Appears calm and comfortable  Respiratory system:  Clear to auscultation. Respiratory effort normal. Cardiovascular system: S1 & S2 heard, RRR. No JVD, murmurs, rubs, gallops or clicks. No pedal edema. Gastrointestinal system: Abdomen is nondistended, soft and nontender. No organomegaly or masses felt. Normal bowel sounds heard. Central nervous system: Alert and oriented to family . No focal neurological deficits. Extremities: Symmetric 5 x 5 power. Skin: No rashes, lesions or ulcers Psychiatry: Judgement and insight appear impaired . Mood & affect appropriate.     Data Reviewed: I have personally reviewed following labs and imaging studies  Micro Results Recent Results (from the past 240 hour(s))  Respiratory Panel by PCR     Status: None   Collection Time: 11/08/16  6:10 PM  Result Value Ref Range Status   Adenovirus NOT DETECTED NOT DETECTED Final   Coronavirus 229E NOT DETECTED NOT DETECTED Final   Coronavirus HKU1 NOT DETECTED NOT DETECTED Final   Coronavirus NL63 NOT DETECTED NOT DETECTED Final   Coronavirus OC43 NOT DETECTED NOT DETECTED Final   Metapneumovirus NOT DETECTED NOT DETECTED Final   Rhinovirus / Enterovirus NOT DETECTED NOT DETECTED Final   Influenza A NOT DETECTED NOT DETECTED Final   Influenza B NOT DETECTED NOT DETECTED Final   Parainfluenza Virus 1 NOT DETECTED NOT DETECTED Final   Parainfluenza Virus 2 NOT DETECTED NOT DETECTED Final   Parainfluenza Virus 3 NOT DETECTED NOT DETECTED Final   Parainfluenza Virus 4 NOT DETECTED NOT DETECTED Final   Respiratory Syncytial Virus NOT DETECTED NOT DETECTED Final   Bordetella pertussis NOT DETECTED NOT DETECTED Final   Chlamydophila pneumoniae NOT DETECTED NOT DETECTED Final   Mycoplasma pneumoniae NOT DETECTED NOT DETECTED Final  Urine culture     Status: Abnormal   Collection Time: 11/14/16 11:10 AM  Result Value Ref Range Status   Specimen Description URINE, CLEAN CATCH  Final   Special Requests NONE  Final   Culture MULTIPLE SPECIES PRESENT, SUGGEST  RECOLLECTION (A)  Final   Report Status 11/15/2016 FINAL  Final    Radiology Reports Dg Chest 2 View  Result Date: 11/14/2016 CLINICAL DATA:  83 year old male with cough for few days. Subsequent encounter. EXAM: CHEST  2 VIEW COMPARISON:  11/08/2016 chest x-ray and chest CT. FINDINGS: Right lower lobe mass noted on recent CT not as well delineated by plain film exam. No pulmonary edema or pneumothorax. Heart size within normal limits. Calcified tortuous aorta. Mild loss of height midthoracic vertebra without change. IMPRESSION: Right lower lobe mass better delineated on recent CT. Findings suspicious for malignancy. Aortic Atherosclerosis (ICD10-I70.0). Electronically Signed   By: Genia Del M.D.   On: 11/14/2016 10:08   Dg Chest 2 View  Result Date: 11/08/2016 CLINICAL DATA:  Shortness of breath, weakness EXAM: CHEST  2 VIEW COMPARISON:  06/11/2013 FINDINGS: There is hyperinflation of the lungs compatible with COPD. Previously  seen right lower lobe mass not as well visualized as on prior CT. Bibasilar atelectasis. Heart is normal size. No acute bony abnormality. IMPRESSION: COPD. Right lower lobe mass not as well visualized as on prior CT. Bibasilar atelectasis. Electronically Signed   By: Rolm Baptise M.D.   On: 11/08/2016 10:53   Ct Head Wo Contrast  Result Date: 11/14/2016 CLINICAL DATA:  Increased generalized weakness for 2 days. Fall this morning. EXAM: CT HEAD WITHOUT CONTRAST CT CERVICAL SPINE WITHOUT CONTRAST TECHNIQUE: Multidetector CT imaging of the head and cervical spine was performed following the standard protocol without intravenous contrast. Multiplanar CT image reconstructions of the cervical spine were also generated. COMPARISON:  None. FINDINGS: CT HEAD FINDINGS Brain: No evidence of acute infarction, hemorrhage, hydrocephalus, extra-axial collection or mass lesion/mass effect. Moderate age-related cerebral atrophy with compensatory dilatation of the ventricles. Periventricular  white matter and corona radiata hypodensities favor chronic ischemic microvascular white matter disease. Vascular: Carotid and vertebral atherosclerotic calcification. No hyperdense vessel. Skull: Normal. Negative for fracture or focal lesion. Sinuses/Orbits: Minimal mucosal thickening in the left maxillary sinus. The remaining bilateral paranasal sinuses and mastoid air cells are clear. Bilateral pseudophakia. Other: None. CT CERVICAL SPINE FINDINGS Alignment: Trace anterolisthesis of C4 on C5 due to facet arthropathy. Skull base and vertebrae: No acute fracture. No primary bone lesion or focal pathologic process. Soft tissues and spinal canal: No prevertebral fluid or swelling. No visible canal hematoma. Atherosclerotic vascular calcifications of the bilateral carotid bulbs. Disc levels: Increased mineralization posterior to the dens, which can be age-related or seen with CPPD. Advanced degenerative disc disease and facet uncovertebral hypertrophy from C3-C4 through C6-C7, resulting in moderate left neuroforaminal stenosis at C3-C4, C4-C5, and C5-C6. Upper chest: Biapical pleuroparenchymal scarring and paraseptal emphysema. Atherosclerotic calcification of the aortic arch. Other: None. IMPRESSION: 1.  No acute intracranial abnormality. 2.  No acute cervical spine fracture. 3.  Aortic Atherosclerosis (ICD10-I70.0). 4.  Emphysema. (UKG25-K27.9) Electronically Signed   By: Titus Dubin M.D.   On: 11/14/2016 10:23   Ct Chest Wo Contrast  Result Date: 11/08/2016 CLINICAL DATA:  History of lung mass. Worsening shortness of breath. EXAM: CT CHEST WITHOUT CONTRAST TECHNIQUE: Multidetector CT imaging of the chest was performed following the standard protocol without IV contrast. COMPARISON:  CT chest dated April 10, 2016. FINDINGS: Cardiovascular: The heart is normal in size. Coronary, aortic arch, and branch vessel atherosclerotic vascular disease. The ascending thoracic aorta stable in size, measuring 4.3 cm in  diameter. Mediastinum/Nodes: Multiple subcentimeter mediastinal lymph nodes are unchanged compared to prior CT. No axillary lymphadenopathy. Moderate hiatal hernia again seen. Lungs/Pleura: The spiculated right lower lobe mass appears grossly similar in size, measuring 3.8 cm AP x 2.8 cm transverse. Similarly, the more peripheral mass like consolidation in the right lobe is also unchanged in size, measuring 3.0 x 1.5 cm. No new lung nodules or masses identified. Moderate upper lobe predominant centrilobular and paraseptal emphysema is similar to prior study. No pleural effusion or pneumothorax. Upper Abdomen: Numerous bilateral renal cysts are similar to prior study. Cholecystectomy. No acute abnormality. Musculoskeletal: Multilevel degenerative changes throughout the thoracic spine and multiple chronic compression deformities are similar to prior study. No suspicious osseous lesion. No fracture. IMPRESSION: 1. No significant interval change in size in the spiculated right lower lobe mass and slightly more peripheral mass-like consolidation within the right lower lobe, concerning for primary lung malignancy. 2. Unchanged ascending thoracic aortic aneurysm, measuring up to 4.3 cm. Recommend annual imaging followup by CTA or  MRA. This recommendation follows 2010 ACCF/AHA/AATS/ACR/ASA/SCA/SCAI/SIR/STS/SVM Guidelines for the Diagnosis and Management of Patients with Thoracic Aortic Disease. Circulation. 2010; 121: H846-N629 3.  Emphysema. (ICD10-J43.9) 4.  Aortic Atherosclerosis (ICD10-I70.0). Electronically Signed   By: Titus Dubin M.D.   On: 11/08/2016 12:51   Ct Cervical Spine Wo Contrast  Result Date: 11/14/2016 CLINICAL DATA:  Increased generalized weakness for 2 days. Fall this morning. EXAM: CT HEAD WITHOUT CONTRAST CT CERVICAL SPINE WITHOUT CONTRAST TECHNIQUE: Multidetector CT imaging of the head and cervical spine was performed following the standard protocol without intravenous contrast. Multiplanar CT  image reconstructions of the cervical spine were also generated. COMPARISON:  None. FINDINGS: CT HEAD FINDINGS Brain: No evidence of acute infarction, hemorrhage, hydrocephalus, extra-axial collection or mass lesion/mass effect. Moderate age-related cerebral atrophy with compensatory dilatation of the ventricles. Periventricular white matter and corona radiata hypodensities favor chronic ischemic microvascular white matter disease. Vascular: Carotid and vertebral atherosclerotic calcification. No hyperdense vessel. Skull: Normal. Negative for fracture or focal lesion. Sinuses/Orbits: Minimal mucosal thickening in the left maxillary sinus. The remaining bilateral paranasal sinuses and mastoid air cells are clear. Bilateral pseudophakia. Other: None. CT CERVICAL SPINE FINDINGS Alignment: Trace anterolisthesis of C4 on C5 due to facet arthropathy. Skull base and vertebrae: No acute fracture. No primary bone lesion or focal pathologic process. Soft tissues and spinal canal: No prevertebral fluid or swelling. No visible canal hematoma. Atherosclerotic vascular calcifications of the bilateral carotid bulbs. Disc levels: Increased mineralization posterior to the dens, which can be age-related or seen with CPPD. Advanced degenerative disc disease and facet uncovertebral hypertrophy from C3-C4 through C6-C7, resulting in moderate left neuroforaminal stenosis at C3-C4, C4-C5, and C5-C6. Upper chest: Biapical pleuroparenchymal scarring and paraseptal emphysema. Atherosclerotic calcification of the aortic arch. Other: None. IMPRESSION: 1.  No acute intracranial abnormality. 2.  No acute cervical spine fracture. 3.  Aortic Atherosclerosis (ICD10-I70.0). 4.  Emphysema. ([id number]-L24.9) Electronically Signed   By: Titus Dubin M.D.   On: 11/14/2016 10:23     CBC  Recent Labs Lab 11/09/16 0301 11/14/16 0854 11/15/16 0424  WBC 2.4* 8.2 6.5  HGB 10.1* 10.7* 11.1*  HCT 31.5* 33.8* 34.3*  PLT 91* 131* 141*  MCV 96.0 97.4  97.7  MCH 30.8 30.8 31.6  MCHC 32.1 31.7 32.4  RDW 12.4 13.0 13.1  LYMPHSABS  --  0.4*  --   MONOABS  --  1.0  --   EOSABS  --  0.0  --   BASOSABS  --  0.0  --     Chemistries   Recent Labs Lab 11/08/16 1721 11/09/16 0301 11/14/16 0854 11/15/16 0424  NA  --  137 142 141  K  --  4.6 4.9 4.6  CL  --  104 110 107  CO2  --  23 24 23   GLUCOSE  --  190* 107* 135*  BUN  --  59* 82* 75*  CREATININE  --  3.88* 3.32* 3.26*  CALCIUM  --  9.7 9.9 9.5  MG 2.0  --   --   --   AST  --   --  21  --   ALT  --   --  17  --   ALKPHOS  --   --  58  --   BILITOT  --   --  1.2  --    ------------------------------------------------------------------------------------------------------------------ estimated creatinine clearance is 17 mL/min (A) (by C-G formula based on SCr of 3.26 mg/dL (H)). ------------------------------------------------------------------------------------------------------------------ No results for input(s): HGBA1C in the  last 72 hours. ------------------------------------------------------------------------------------------------------------------ No results for input(s): CHOL, HDL, LDLCALC, TRIG, CHOLHDL, LDLDIRECT in the last 72 hours. ------------------------------------------------------------------------------------------------------------------ No results for input(s): TSH, T4TOTAL, T3FREE, THYROIDAB in the last 72 hours.  Invalid input(s): FREET3 ------------------------------------------------------------------------------------------------------------------ No results for input(s): VITAMINB12, FOLATE, FERRITIN, TIBC, IRON, RETICCTPCT in the last 72 hours.  Coagulation profile No results for input(s): INR, PROTIME in the last 168 hours.  No results for input(s): DDIMER in the last 72 hours.  Cardiac Enzymes  Recent Labs Lab 11/14/16 1805  TROPONINI 0.58*    ------------------------------------------------------------------------------------------------------------------ Invalid input(s): POCBNP   CBG: No results for input(s): GLUCAP in the last 168 hours.     Studies: Dg Chest 2 View  Result Date: 11/14/2016 CLINICAL DATA:  83 year old male with cough for few days. Subsequent encounter. EXAM: CHEST  2 VIEW COMPARISON:  11/08/2016 chest x-ray and chest CT. FINDINGS: Right lower lobe mass noted on recent CT not as well delineated by plain film exam. No pulmonary edema or pneumothorax. Heart size within normal limits. Calcified tortuous aorta. Mild loss of height midthoracic vertebra without change. IMPRESSION: Right lower lobe mass better delineated on recent CT. Findings suspicious for malignancy. Aortic Atherosclerosis (ICD10-I70.0). Electronically Signed   By: Genia Del M.D.   On: 11/14/2016 10:08   Ct Head Wo Contrast  Result Date: 11/14/2016 CLINICAL DATA:  Increased generalized weakness for 2 days. Fall this morning. EXAM: CT HEAD WITHOUT CONTRAST CT CERVICAL SPINE WITHOUT CONTRAST TECHNIQUE: Multidetector CT imaging of the head and cervical spine was performed following the standard protocol without intravenous contrast. Multiplanar CT image reconstructions of the cervical spine were also generated. COMPARISON:  None. FINDINGS: CT HEAD FINDINGS Brain: No evidence of acute infarction, hemorrhage, hydrocephalus, extra-axial collection or mass lesion/mass effect. Moderate age-related cerebral atrophy with compensatory dilatation of the ventricles. Periventricular white matter and corona radiata hypodensities favor chronic ischemic microvascular white matter disease. Vascular: Carotid and vertebral atherosclerotic calcification. No hyperdense vessel. Skull: Normal. Negative for fracture or focal lesion. Sinuses/Orbits: Minimal mucosal thickening in the left maxillary sinus. The remaining bilateral paranasal sinuses and mastoid air cells are  clear. Bilateral pseudophakia. Other: None. CT CERVICAL SPINE FINDINGS Alignment: Trace anterolisthesis of C4 on C5 due to facet arthropathy. Skull base and vertebrae: No acute fracture. No primary bone lesion or focal pathologic process. Soft tissues and spinal canal: No prevertebral fluid or swelling. No visible canal hematoma. Atherosclerotic vascular calcifications of the bilateral carotid bulbs. Disc levels: Increased mineralization posterior to the dens, which can be age-related or seen with CPPD. Advanced degenerative disc disease and facet uncovertebral hypertrophy from C3-C4 through C6-C7, resulting in moderate left neuroforaminal stenosis at C3-C4, C4-C5, and C5-C6. Upper chest: Biapical pleuroparenchymal scarring and paraseptal emphysema. Atherosclerotic calcification of the aortic arch. Other: None. IMPRESSION: 1.  No acute intracranial abnormality. 2.  No acute cervical spine fracture. 3.  Aortic Atherosclerosis (ICD10-I70.0). 4.  Emphysema. (KVQ25-Z56.9) Electronically Signed   By: Titus Dubin M.D.   On: 11/14/2016 10:23   Ct Cervical Spine Wo Contrast  Result Date: 11/14/2016 CLINICAL DATA:  Increased generalized weakness for 2 days. Fall this morning. EXAM: CT HEAD WITHOUT CONTRAST CT CERVICAL SPINE WITHOUT CONTRAST TECHNIQUE: Multidetector CT imaging of the head and cervical spine was performed following the standard protocol without intravenous contrast. Multiplanar CT image reconstructions of the cervical spine were also generated. COMPARISON:  None. FINDINGS: CT HEAD FINDINGS Brain: No evidence of acute infarction, hemorrhage, hydrocephalus, extra-axial collection or mass lesion/mass effect. Moderate age-related cerebral atrophy with compensatory  dilatation of the ventricles. Periventricular white matter and corona radiata hypodensities favor chronic ischemic microvascular white matter disease. Vascular: Carotid and vertebral atherosclerotic calcification. No hyperdense vessel. Skull:  Normal. Negative for fracture or focal lesion. Sinuses/Orbits: Minimal mucosal thickening in the left maxillary sinus. The remaining bilateral paranasal sinuses and mastoid air cells are clear. Bilateral pseudophakia. Other: None. CT CERVICAL SPINE FINDINGS Alignment: Trace anterolisthesis of C4 on C5 due to facet arthropathy. Skull base and vertebrae: No acute fracture. No primary bone lesion or focal pathologic process. Soft tissues and spinal canal: No prevertebral fluid or swelling. No visible canal hematoma. Atherosclerotic vascular calcifications of the bilateral carotid bulbs. Disc levels: Increased mineralization posterior to the dens, which can be age-related or seen with CPPD. Advanced degenerative disc disease and facet uncovertebral hypertrophy from C3-C4 through C6-C7, resulting in moderate left neuroforaminal stenosis at C3-C4, C4-C5, and C5-C6. Upper chest: Biapical pleuroparenchymal scarring and paraseptal emphysema. Atherosclerotic calcification of the aortic arch. Other: None. IMPRESSION: 1.  No acute intracranial abnormality. 2.  No acute cervical spine fracture. 3.  Aortic Atherosclerosis (ICD10-I70.0). 4.  Emphysema. (PQD82-M41.9) Electronically Signed   By: Titus Dubin M.D.   On: 11/14/2016 10:23      No results found for: HGBA1C Lab Results  Component Value Date   CREATININE 3.26 (H) 11/15/2016       Scheduled Meds: . aspirin EC  81 mg Oral Daily  . famotidine  20 mg Oral Daily  . feeding supplement (ENSURE ENLIVE)  237 mL Oral BID BM  . heparin  5,000 Units Subcutaneous Q8H  . ipratropium-albuterol  3 mL Nebulization Q6H  . pravastatin  20 mg Oral Daily  . predniSONE  20 mg Oral Q breakfast  . vitamin B-12  1,000 mcg Oral Daily   Continuous Infusions: . cefTRIAXone (ROCEPHIN)  IV       LOS: 0 days    Time spent: >30 MINS    Reyne Dumas  [facility] Pager [phone]. If 7PM-7AM, please contact night-coverage at [email or web address], password  [facility] 11/15/2016, 12:52 PM  LOS: 0 days

## 2016-11-15 NOTE — Evaluation (Signed)
Physical Therapy Evaluation Patient Details Name: Derrick [name] MRN: [id number] DOB: [birth year] Today's Date: 11/15/2016   History of Present Illness  83 y.o. 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 Initial evaluation reveals a urinary tract infection and an elevated troponin.  Clinical Impression  Pt admitted with above diagnosis. Pt currently with functional limitations due to the deficits listed below (see PT Problem List).  Pt will benefit from skilled PT to increase their independence and safety with mobility to allow discharge to the venue listed below.  Pt with significant decline in mobility since last admission one week ago.  Pt had difficulty following instructions with SPT to [facility] and required MOD A.  His o2 sat dropped to 86% on 3L/min with SPT.  At this time recommend SNF due to feeling his wife could not handle him at his current mobility level.  Will continue to monitor during acute stay to see if home will be a safe option.  Wife to discuss with family and at palliative meeting.     Follow Up Recommendations SNF;Supervision/Assistance - 24 hour    Equipment Recommendations  None recommended by PT    Recommendations for Other Services       Precautions / Restrictions Precautions Precautions: Fall Restrictions Weight Bearing Restrictions: No      Mobility  Bed Mobility Overal bed mobility: Needs Assistance Bed Mobility: Supine to Sit;Sit to Supine     Supine to sit: Min assist Sit to supine: Min assist   General bed mobility comments: MIN A for  trunk  Transfers Overall transfer level: Needs assistance Equipment used: Rolling walker (2 wheeled) Transfers: Sit to/from [facility] Sit to Stand: Mod assist Stand pivot transfers: Mod assist       General transfer comment: Sit to stand with heavy multi-modal cueing.  Pt had difficulty sequencing with SPT with RW bed <> BSC.  O2 dropped to 86% on 3 L/min on 1st transfer and increased o2 to 4L for 2nd transfer.  Transfer back to bed was unsafe with pt unable to follow instructions while taking ataxic steps and had to be assisted to sitting due to weakness and note following directions.  Ambulation/Gait             General Gait Details: deferred  Stairs            Wheelchair Mobility    Modified Rankin (Stroke Patients Only)       Balance Overall balance assessment: Needs assistance Sitting-balance support: Feet supported Sitting balance-Leahy Scale: Fair     Standing balance support: Bilateral upper extremity supported Standing balance-Leahy Scale: Poor                               Pertinent Vitals/Pain Pain Assessment: Faces Faces Pain Scale: Hurts little more Pain Location: abdomen  Pain Descriptors / Indicators: Discomfort Pain Intervention(s): Limited activity within patient's tolerance;Monitored during session;Repositioned;Other (comment) (transferred to [facility] to attempt to have BM)    [place] expects to be discharged to:: Private residence Living Arrangements: Spouse/significant other Available Help at Discharge: Family;Available 24 hours/day Type of Home: House Home Access: Stairs  to enter Entrance Stairs-Rails: None Entrance Stairs-Number of Steps: 2 Home Layout: One level Home Equipment: Walker - 2 wheels;Cane - single point Additional Comments: has access to [facility] and shower chair    Prior Function Level of Independence: Independent               Hand Dominance   Dominant Hand:  Right    Extremity/Trunk Assessment   Upper Extremity Assessment Upper Extremity Assessment: Generalized weakness    Lower Extremity Assessment Lower Extremity Assessment: Generalized weakness    Cervical / Trunk Assessment Cervical / Trunk Assessment: Kyphotic  Communication   Communication: No difficulties  Cognition Arousal/Alertness: Awake/alert Behavior During Therapy: Flat affect Overall Cognitive Status: Impaired/Different from baseline Area of Impairment: Following commands;Problem solving                       Following Commands: Follows one step commands inconsistently     Problem Solving: Slow processing;Difficulty sequencing;Requires verbal cues;Requires tactile cues General Comments: HOH, but even when speaking loudly into ear (a level wife feels he should hear) he had difficulty following instructions      General Comments General comments (skin integrity, edema, etc.): wife present and stated that she noticed at home pt not following directions last day or two.  Lengthy discussion with wife regarding d/c plans.  Palliative meeting scheduled later today per wife and deferred some of her questions until after meeting.    Exercises     Assessment/Plan    PT Assessment Patient needs continued PT services  PT Problem List Decreased strength;Decreased activity tolerance;Decreased balance;Decreased mobility;Decreased cognition;Decreased knowledge of use of DME;Decreased safety awareness       PT Treatment Interventions DME instruction;Gait training;Functional mobility training;Therapeutic activities;Therapeutic exercise;Neuromuscular re-education;Patient/family education    PT Goals (Current goals can be found in the Care Plan section)  Acute Rehab PT Goals Patient Stated Goal: for him to get stronger PT Goal Formulation: With patient/family Time For Goal Achievement: 11/29/16 Potential to Achieve Goals: Fair    Frequency Min 3X/week   Barriers  to discharge        Co-evaluation               AM-PAC PT "6 Clicks" Daily Activity  Outcome Measure Difficulty turning over in bed (including adjusting bedclothes, sheets and blankets)?: Total Difficulty moving from lying on back to sitting on the side of the bed? : Total Difficulty sitting down on and standing up from a chair with arms (e.g., wheelchair, bedside commode, etc,.)?: Total Help needed moving to and from a bed to chair (including a wheelchair)?: A Lot Help needed walking in hospital room?: A Lot Help needed climbing 3-5 steps with a railing? : A Lot 6 Click Score: 9    End of Session Equipment Utilized During Treatment: Gait belt;Oxygen Activity Tolerance: Patient limited by fatigue Patient left: in bed;with call bell/phone within reach;with bed alarm set;with family/visitor present (back on 3 L/min with o2 sat 97%) Nurse Communication: Mobility status PT Visit Diagnosis: Unsteadiness on feet (R26.81);Other abnormalities of gait and mobility (R26.89);Muscle weakness (generalized) (M62.81)    Time: 3825-0539 PT Time Calculation (min) (ACUTE ONLY): 45 min   Charges:   PT Evaluation $PT Eval Moderate Complexity: 1 Mod PT Treatments $Therapeutic Activity: 23-37 mins   PT G Codes:   PT G-Codes **NOT FOR INPATIENT CLASS** Functional Assessment Tool Used: AM-PAC 6 Clicks Basic Mobility;Clinical judgement Functional Limitation: Mobility: Walking and moving around Mobility: Walking and Moving Around Current  Status [phone]): At least 60 percent but less than 80 percent impaired, limited or restricted Mobility: Walking and Moving Around Goal Status [phone]): At least 20 percent but less than 40 percent impaired, limited or restricted    [name] Glad L. Tamala Julian, [place] Pager [phone] 11/15/2016   Galen Manila 11/15/2016, 12:49 PM

## 2016-11-16 DIAGNOSIS — I481 Persistent atrial fibrillation: Secondary | ICD-10-CM | POA: Diagnosis not present

## 2016-11-16 DIAGNOSIS — Z9049 Acquired absence of other specified parts of digestive tract: Secondary | ICD-10-CM | POA: Diagnosis not present

## 2016-11-16 DIAGNOSIS — N10 Acute tubulo-interstitial nephritis: Secondary | ICD-10-CM | POA: Diagnosis not present

## 2016-11-16 DIAGNOSIS — N3001 Acute cystitis with hematuria: Secondary | ICD-10-CM

## 2016-11-16 DIAGNOSIS — Z8679 Personal history of other diseases of the circulatory system: Secondary | ICD-10-CM | POA: Diagnosis not present

## 2016-11-16 DIAGNOSIS — Z8546 Personal history of malignant neoplasm of prostate: Secondary | ICD-10-CM | POA: Diagnosis not present

## 2016-11-16 DIAGNOSIS — Z7982 Long term (current) use of aspirin: Secondary | ICD-10-CM | POA: Diagnosis not present

## 2016-11-16 DIAGNOSIS — Z7189 Other specified counseling: Secondary | ICD-10-CM | POA: Diagnosis not present

## 2016-11-16 DIAGNOSIS — D693 Immune thrombocytopenic purpura: Secondary | ICD-10-CM | POA: Diagnosis present

## 2016-11-16 DIAGNOSIS — R531 Weakness: Secondary | ICD-10-CM | POA: Diagnosis present

## 2016-11-16 DIAGNOSIS — Z79899 Other long term (current) drug therapy: Secondary | ICD-10-CM | POA: Diagnosis not present

## 2016-11-16 DIAGNOSIS — I4891 Unspecified atrial fibrillation: Secondary | ICD-10-CM | POA: Diagnosis present

## 2016-11-16 DIAGNOSIS — Z8249 Family history of ischemic heart disease and other diseases of the circulatory system: Secondary | ICD-10-CM | POA: Diagnosis not present

## 2016-11-16 DIAGNOSIS — Z87891 Personal history of nicotine dependence: Secondary | ICD-10-CM | POA: Diagnosis not present

## 2016-11-16 DIAGNOSIS — N390 Urinary tract infection, site not specified: Secondary | ICD-10-CM | POA: Diagnosis present

## 2016-11-16 DIAGNOSIS — Z515 Encounter for palliative care: Secondary | ICD-10-CM | POA: Diagnosis present

## 2016-11-16 DIAGNOSIS — R748 Abnormal levels of other serum enzymes: Secondary | ICD-10-CM | POA: Diagnosis present

## 2016-11-16 DIAGNOSIS — Z9981 Dependence on supplemental oxygen: Secondary | ICD-10-CM | POA: Diagnosis not present

## 2016-11-16 DIAGNOSIS — J410 Simple chronic bronchitis: Secondary | ICD-10-CM | POA: Diagnosis not present

## 2016-11-16 DIAGNOSIS — Z823 Family history of stroke: Secondary | ICD-10-CM | POA: Diagnosis not present

## 2016-11-16 DIAGNOSIS — Z66 Do not resuscitate: Secondary | ICD-10-CM | POA: Diagnosis present

## 2016-11-16 DIAGNOSIS — R918 Other nonspecific abnormal finding of lung field: Secondary | ICD-10-CM | POA: Diagnosis present

## 2016-11-16 DIAGNOSIS — Z820 Family history of epilepsy and other diseases of the nervous system: Secondary | ICD-10-CM | POA: Diagnosis not present

## 2016-11-16 DIAGNOSIS — N184 Chronic kidney disease, stage 4 (severe): Secondary | ICD-10-CM

## 2016-11-16 DIAGNOSIS — J449 Chronic obstructive pulmonary disease, unspecified: Secondary | ICD-10-CM | POA: Diagnosis present

## 2016-11-16 DIAGNOSIS — Z9079 Acquired absence of other genital organ(s): Secondary | ICD-10-CM | POA: Diagnosis not present

## 2016-11-16 DIAGNOSIS — R627 Adult failure to thrive: Secondary | ICD-10-CM | POA: Diagnosis present

## 2016-11-16 MED ORDER — LORAZEPAM 0.5 MG PO TABS: 0.5000 mg | ORAL_TABLET | Freq: Four times a day (QID) | ORAL | Status: DC | PRN

## 2016-11-16 NOTE — Progress Notes (Signed)
Patient ID: Derrick [name], male   DOB: 1928/10/10, 83 y.o.   MRN: [id number]  This NP visited patient at the bedside, with wife and son, as a follow up to  yesterday's GOCs meeting.  [name] is more alert today however he remains ill appearing.  Continued conversation regarding diagnosis prognosis goals of care end of life wishes and disposition.  Decision remains for full comfort,  family has sense of peace regarding decision.     Focus of care is comfort quality and dignity.  Plan is for home with hospice services; will notify case management to offer choice.      -Prognosis is likely weeks to months with the focus being on comfort and no desire for life prolonging measures       -Symptom management: Roxanol/Ativan prn    Family is interested in hospice facility when patient is eligible.  Family is actively putting together a care plan for in the home.  Discussed with Dr. Allyson Sabal for likely discharge tomorrow morning.    Discussed with family the importance of continued conversation with family and their  medical providers regarding overall plan of care and treatment options,  ensuring decisions are within the context of the patients values and GOCs.  Questions and concerns addressed  Time in  0900         Time out  1000   Total time spent on the unit was 60 minutes  Greater than 50% of the time was spent in counseling and coordination of care  Wadie Lessen NP  Palliative Medicine Team Team Phone # [phone] Pager [phone]

## 2016-11-16 NOTE — Progress Notes (Signed)
[facility] PROGRESS NOTE  Derrick [name] CHY:[id number] DOB: 03-02-1929 DOA: 11/14/2016   PCP: Crist Infante, MD     Assessment/Plan: Principal Problem:   UTI (urinary tract infection) Active Problems:   History of prostate cancer   Atrial fibrillation (HCC)   Mass of right lung   CKD (chronic kidney disease) stage 4, GFR 15-29 ml/min (HCC)   COPD (chronic obstructive pulmonary disease) (HCC)   Generalized weakness   Elevated troponin   Palliative care by specialist   Adult failure to thrive   DNR (do not resuscitate)     83 y.o. male medical history significant for COPD just recently discharged from hospital for exacerbation on home O2, atrial fibrillation not on anticoagulation, known spiculated right lung mass suspected to be malignancy for which patient has deferred additional evaluation, stage IV chronic kidney disease, chronic idiopathic thrombocytopenia and prostate cancer presents emergency Department chief complaint generalized weakness with recent fall, general deconditioning and gradual approach to end of life. Pt w/ massive lung mass that is likely cancer. Pt w/ multiple recent admission and this is likely to continue. Palliative care consult greatly appreciated for [place] discussions.   Assessment and plan #1. Urinary tract infection.   Change rocephin to omnicef PO 5 days Urine culture nonspecific    #2. Chronic kidney disease stage IV. Creatinine 3.3> 3.26  on admission. This appears to be close to his baseline. No further labs    #3. Atrial fibrillation/elevated troponin. Currently rate controlled. Not on anticoagulation. Home medications include weight control meds. Initial troponin 0.16, now 0.58.  No reports of chest pain. EKG without acute changes. Not a candidate for intervention. No further troponins    #4. COPD on home oxygen. Patient recently hospitalized with acute respiratory failure presumably from a COPD exacerbation. Has not had an  official diagnosis. Chest x-ray right lower lobe mass that appears to be stable on repeat CT scan this admission Oxygen for comfort, We will transition to hospice at home tomorrow    #5. Massive right lung mass. Patient has deferred formal workup/biopsy.  CT scan reviewed    #6. Generalized weakness/fall. Likely related to all the above. Infectious process in the setting of likely progressive disease.  Probable discharge to home with hospice tomorrow    DVT prophylaxsis heparin  Code Status:  DO NOT RESUSCITATE    Family Communication: Discussed in detail with the patient, all imaging results, lab results explained to the patient   Disposition Plan:  Palliative care consult for goc, family agreeable to comfort care measures and open to home with hospice      Consultants:  Palliative care  Procedures:  None  Antibiotics: Anti-infectives    Start     Dose/Rate Route Frequency Ordered Stop   11/15/16 1415  cefdinir (OMNICEF) 125 MG/5ML suspension 250 mg     250 mg Oral 2 times daily 11/15/16 1346 11/20/16 0959   11/15/16 1230  cefTRIAXone (ROCEPHIN) 1 g in dextrose 5 % 50 mL IVPB  Status:  Discontinued     1 g 100 mL/hr over 30 Minutes Intravenous Every 24 hours 11/14/16 1243 11/15/16 1346   11/14/16 1215  cefTRIAXone (ROCEPHIN) 1 g in dextrose 5 % 50 mL IVPB     1 g 100 mL/hr over 30 Minutes Intravenous  Once 11/14/16 1206 11/14/16 1337         HPI/Subjective: MORE AWAKE AND COMFORTABLE   Objective: Vitals:   11/15/16 2014 11/15/16 2028 11/16/16  0618 11/16/16 0901  BP:  132/79 134/81 125/74  Pulse: 81 92 97 90  Resp: 16 15 16 16   Temp:  97.6 F (36.4 C) 97.6 F (36.4 C) 97.9 F (36.6 C)  TempSrc:    Oral  SpO2: 93% 96% 100% 100%  Weight:  73.9 kg (163 lb)    Height:        Intake/Output Summary (Last 24 hours) at 11/16/16 1013 Last data filed at 11/16/16 0901  Gross per 24 hour  Intake              480 ml  Output              500 ml  Net               -20 ml    Exam:  Examination:  General exam: Appears calm and comfortable  Respiratory system: Clear to auscultation. Respiratory effort normal. Cardiovascular system: S1 & S2 heard, RRR. No JVD, murmurs, rubs, gallops or clicks. No pedal edema. Gastrointestinal system: Abdomen is nondistended, soft and nontender. No organomegaly or masses felt. Normal bowel sounds heard. Central nervous system: Alert and oriented to family . No focal neurological deficits. Extremities: Symmetric 5 x 5 power. Skin: No rashes, lesions or ulcers Psychiatry: Judgement and insight appear impaired . Mood & affect appropriate.     Data Reviewed: I have personally reviewed following labs and imaging studies  Micro Results Recent Results (from the past 240 hour(s))  Respiratory Panel by PCR     Status: None   Collection Time: 11/08/16  6:10 PM  Result Value Ref Range Status   Adenovirus NOT DETECTED NOT DETECTED Final   Coronavirus 229E NOT DETECTED NOT DETECTED Final   Coronavirus HKU1 NOT DETECTED NOT DETECTED Final   Coronavirus NL63 NOT DETECTED NOT DETECTED Final   Coronavirus OC43 NOT DETECTED NOT DETECTED Final   Metapneumovirus NOT DETECTED NOT DETECTED Final   Rhinovirus / Enterovirus NOT DETECTED NOT DETECTED Final   Influenza A NOT DETECTED NOT DETECTED Final   Influenza B NOT DETECTED NOT DETECTED Final   Parainfluenza Virus 1 NOT DETECTED NOT DETECTED Final   Parainfluenza Virus 2 NOT DETECTED NOT DETECTED Final   Parainfluenza Virus 3 NOT DETECTED NOT DETECTED Final   Parainfluenza Virus 4 NOT DETECTED NOT DETECTED Final   Respiratory Syncytial Virus NOT DETECTED NOT DETECTED Final   Bordetella pertussis NOT DETECTED NOT DETECTED Final   Chlamydophila pneumoniae NOT DETECTED NOT DETECTED Final   Mycoplasma pneumoniae NOT DETECTED NOT DETECTED Final  Urine culture     Status: Abnormal   Collection Time: 11/14/16 11:10 AM  Result Value Ref Range Status   Specimen Description  URINE, CLEAN CATCH  Final   Special Requests NONE  Final   Culture MULTIPLE SPECIES PRESENT, SUGGEST RECOLLECTION (A)  Final   Report Status 11/15/2016 FINAL  Final  Culture, blood (routine x 2)     Status: None (Preliminary result)   Collection Time: 11/14/16  8:39 PM  Result Value Ref Range Status   Specimen Description BLOOD LEFT HAND  Final   Special Requests   Final    BOTTLES DRAWN AEROBIC AND ANAEROBIC Blood Culture adequate volume   Culture NO GROWTH < 24 HOURS  Final   Report Status PENDING  Incomplete  Culture, blood (routine x 2)     Status: None (Preliminary result)   Collection Time: 11/14/16  8:44 PM  Result Value Ref Range Status   Specimen Description BLOOD  LEFT ANTECUBITAL  Final   Special Requests   Final    BOTTLES DRAWN AEROBIC AND ANAEROBIC Blood Culture adequate volume   Culture NO GROWTH < 24 HOURS  Final   Report Status PENDING  Incomplete    Radiology Reports Dg Chest 2 View  Result Date: 11/14/2016 CLINICAL DATA:  83 year old male with cough for few days. Subsequent encounter. EXAM: CHEST  2 VIEW COMPARISON:  11/08/2016 chest x-ray and chest CT. FINDINGS: Right lower lobe mass noted on recent CT not as well delineated by plain film exam. No pulmonary edema or pneumothorax. Heart size within normal limits. Calcified tortuous aorta. Mild loss of height midthoracic vertebra without change. IMPRESSION: Right lower lobe mass better delineated on recent CT. Findings suspicious for malignancy. Aortic Atherosclerosis (ICD10-I70.0). Electronically Signed   By: Genia Del M.D.   On: 11/14/2016 10:08   Dg Chest 2 View  Result Date: 11/08/2016 CLINICAL DATA:  Shortness of breath, weakness EXAM: CHEST  2 VIEW COMPARISON:  06/11/2013 FINDINGS: There is hyperinflation of the lungs compatible with COPD. Previously seen right lower lobe mass not as well visualized as on prior CT. Bibasilar atelectasis. Heart is normal size. No acute bony abnormality. IMPRESSION: COPD. Right  lower lobe mass not as well visualized as on prior CT. Bibasilar atelectasis. Electronically Signed   By: Rolm Baptise M.D.   On: 11/08/2016 10:53   Ct Head Wo Contrast  Result Date: 11/14/2016 CLINICAL DATA:  Increased generalized weakness for 2 days. Fall this morning. EXAM: CT HEAD WITHOUT CONTRAST CT CERVICAL SPINE WITHOUT CONTRAST TECHNIQUE: Multidetector CT imaging of the head and cervical spine was performed following the standard protocol without intravenous contrast. Multiplanar CT image reconstructions of the cervical spine were also generated. COMPARISON:  None. FINDINGS: CT HEAD FINDINGS Brain: No evidence of acute infarction, hemorrhage, hydrocephalus, extra-axial collection or mass lesion/mass effect. Moderate age-related cerebral atrophy with compensatory dilatation of the ventricles. Periventricular white matter and corona radiata hypodensities favor chronic ischemic microvascular white matter disease. Vascular: Carotid and vertebral atherosclerotic calcification. No hyperdense vessel. Skull: Normal. Negative for fracture or focal lesion. Sinuses/Orbits: Minimal mucosal thickening in the left maxillary sinus. The remaining bilateral paranasal sinuses and mastoid air cells are clear. Bilateral pseudophakia. Other: None. CT CERVICAL SPINE FINDINGS Alignment: Trace anterolisthesis of C4 on C5 due to facet arthropathy. Skull base and vertebrae: No acute fracture. No primary bone lesion or focal pathologic process. Soft tissues and spinal canal: No prevertebral fluid or swelling. No visible canal hematoma. Atherosclerotic vascular calcifications of the bilateral carotid bulbs. Disc levels: Increased mineralization posterior to the dens, which can be age-related or seen with CPPD. Advanced degenerative disc disease and facet uncovertebral hypertrophy from C3-C4 through C6-C7, resulting in moderate left neuroforaminal stenosis at C3-C4, C4-C5, and C5-C6. Upper chest: Biapical pleuroparenchymal scarring  and paraseptal emphysema. Atherosclerotic calcification of the aortic arch. Other: None. IMPRESSION: 1.  No acute intracranial abnormality. 2.  No acute cervical spine fracture. 3.  Aortic Atherosclerosis (ICD10-I70.0). 4.  Emphysema. (RJJ88-C16.9) Electronically Signed   By: Titus Dubin M.D.   On: 11/14/2016 10:23   Ct Chest Wo Contrast  Result Date: 11/08/2016 CLINICAL DATA:  History of lung mass. Worsening shortness of breath. EXAM: CT CHEST WITHOUT CONTRAST TECHNIQUE: Multidetector CT imaging of the chest was performed following the standard protocol without IV contrast. COMPARISON:  CT chest dated April 10, 2016. FINDINGS: Cardiovascular: The heart is normal in size. Coronary, aortic arch, and branch vessel atherosclerotic vascular disease. The ascending thoracic  aorta stable in size, measuring 4.3 cm in diameter. Mediastinum/Nodes: Multiple subcentimeter mediastinal lymph nodes are unchanged compared to prior CT. No axillary lymphadenopathy. Moderate hiatal hernia again seen. Lungs/Pleura: The spiculated right lower lobe mass appears grossly similar in size, measuring 3.8 cm AP x 2.8 cm transverse. Similarly, the more peripheral mass like consolidation in the right lobe is also unchanged in size, measuring 3.0 x 1.5 cm. No new lung nodules or masses identified. Moderate upper lobe predominant centrilobular and paraseptal emphysema is similar to prior study. No pleural effusion or pneumothorax. Upper Abdomen: Numerous bilateral renal cysts are similar to prior study. Cholecystectomy. No acute abnormality. Musculoskeletal: Multilevel degenerative changes throughout the thoracic spine and multiple chronic compression deformities are similar to prior study. No suspicious osseous lesion. No fracture. IMPRESSION: 1. No significant interval change in size in the spiculated right lower lobe mass and slightly more peripheral mass-like consolidation within the right lower lobe, concerning for primary lung  malignancy. 2. Unchanged ascending thoracic aortic aneurysm, measuring up to 4.3 cm. Recommend annual imaging followup by CTA or MRA. This recommendation follows 2010 ACCF/AHA/AATS/ACR/ASA/SCA/SCAI/SIR/STS/SVM Guidelines for the Diagnosis and Management of Patients with Thoracic Aortic Disease. Circulation. 2010; 121: N361-W431 3.  Emphysema. (ICD10-J43.9) 4.  Aortic Atherosclerosis (ICD10-I70.0). Electronically Signed   By: Titus Dubin M.D.   On: 11/08/2016 12:51   Ct Cervical Spine Wo Contrast  Result Date: 11/14/2016 CLINICAL DATA:  Increased generalized weakness for 2 days. Fall this morning. EXAM: CT HEAD WITHOUT CONTRAST CT CERVICAL SPINE WITHOUT CONTRAST TECHNIQUE: Multidetector CT imaging of the head and cervical spine was performed following the standard protocol without intravenous contrast. Multiplanar CT image reconstructions of the cervical spine were also generated. COMPARISON:  None. FINDINGS: CT HEAD FINDINGS Brain: No evidence of acute infarction, hemorrhage, hydrocephalus, extra-axial collection or mass lesion/mass effect. Moderate age-related cerebral atrophy with compensatory dilatation of the ventricles. Periventricular white matter and corona radiata hypodensities favor chronic ischemic microvascular white matter disease. Vascular: Carotid and vertebral atherosclerotic calcification. No hyperdense vessel. Skull: Normal. Negative for fracture or focal lesion. Sinuses/Orbits: Minimal mucosal thickening in the left maxillary sinus. The remaining bilateral paranasal sinuses and mastoid air cells are clear. Bilateral pseudophakia. Other: None. CT CERVICAL SPINE FINDINGS Alignment: Trace anterolisthesis of C4 on C5 due to facet arthropathy. Skull base and vertebrae: No acute fracture. No primary bone lesion or focal pathologic process. Soft tissues and spinal canal: No prevertebral fluid or swelling. No visible canal hematoma. Atherosclerotic vascular calcifications of the bilateral carotid  bulbs. Disc levels: Increased mineralization posterior to the dens, which can be age-related or seen with CPPD. Advanced degenerative disc disease and facet uncovertebral hypertrophy from C3-C4 through C6-C7, resulting in moderate left neuroforaminal stenosis at C3-C4, C4-C5, and C5-C6. Upper chest: Biapical pleuroparenchymal scarring and paraseptal emphysema. Atherosclerotic calcification of the aortic arch. Other: None. IMPRESSION: 1.  No acute intracranial abnormality. 2.  No acute cervical spine fracture. 3.  Aortic Atherosclerosis (ICD10-I70.0). 4.  Emphysema. (VQM08-Q76.9) Electronically Signed   By: Titus Dubin M.D.   On: 11/14/2016 10:23     CBC  Recent Labs Lab 11/14/16 0854 11/15/16 0424  WBC 8.2 6.5  HGB 10.7* 11.1*  HCT 33.8* 34.3*  PLT 131* 141*  MCV 97.4 97.7  MCH 30.8 31.6  MCHC 31.7 32.4  RDW 13.0 13.1  LYMPHSABS 0.4*  --   MONOABS 1.0  --   EOSABS 0.0  --   BASOSABS 0.0  --     Chemistries   Recent Labs  Lab 11/14/16 0854 11/15/16 0424  NA 142 141  K 4.9 4.6  CL 110 107  CO2 24 23  GLUCOSE 107* 135*  BUN 82* 75*  CREATININE 3.32* 3.26*  CALCIUM 9.9 9.5  AST 21  --   ALT 17  --   ALKPHOS 58  --   BILITOT 1.2  --    ------------------------------------------------------------------------------------------------------------------ estimated creatinine clearance is 17 mL/min (A) (by C-G formula based on SCr of 3.26 mg/dL (H)). ------------------------------------------------------------------------------------------------------------------ No results for input(s): HGBA1C in the last 72 hours. ------------------------------------------------------------------------------------------------------------------ No results for input(s): CHOL, HDL, LDLCALC, TRIG, CHOLHDL, LDLDIRECT in the last 72 hours. ------------------------------------------------------------------------------------------------------------------ No results for input(s): TSH, T4TOTAL,  T3FREE, THYROIDAB in the last 72 hours.  Invalid input(s): FREET3 ------------------------------------------------------------------------------------------------------------------ No results for input(s): VITAMINB12, FOLATE, FERRITIN, TIBC, IRON, RETICCTPCT in the last 72 hours.  Coagulation profile No results for input(s): INR, PROTIME in the last 168 hours.  No results for input(s): DDIMER in the last 72 hours.  Cardiac Enzymes  Recent Labs Lab 11/14/16 1805  TROPONINI 0.58*   ------------------------------------------------------------------------------------------------------------------ Invalid input(s): POCBNP   CBG: No results for input(s): GLUCAP in the last 168 hours.     Studies: No results found.    No results found for: HGBA1C Lab Results  Component Value Date   CREATININE 3.26 (H) 11/15/2016       Scheduled Meds: . cefdinir  250 mg Oral BID  . famotidine  20 mg Oral Daily  . feeding supplement (ENSURE ENLIVE)  237 mL Oral BID BM  . ipratropium-albuterol  3 mL Nebulization TID   Continuous Infusions:    LOS: 0 days    Time spent: >30 MINS    Reyne Dumas  [facility] Pager [phone]. If 7PM-7AM, please contact night-coverage at [email or web address], password [facility] 11/16/2016, 10:13 AM  LOS: 0 days

## 2016-11-16 NOTE — Progress Notes (Signed)
[facility]) Hospital Liaison:  RN visit  Notified by Aldona Bar [facility], of patient/family request for [facility] services at home after discharge.  Chart and patient information under review by [facility] physician.  Hospice eligibility pending at this time.   Writer spoke with wife, over the phone, to initiate education related to hospice philosophy, services and team approach to care.  Patient/family verbalized understanding of information given.  Per discussion, plan is for discharge to home by [facility] on 11/17/16.  Please send signed and completed DNR form home with patient/family.  Patient will need prescriptions for discharge comfort medications.    DME needs have been discussed, patient currently has the following equipment in the home:  O2 concentrator, shower chair, wheelchair, walker.  Family requests the following DME for delivery to the home:  Hospital bed with 1/2 rails, OBT and 3 in 1.  [facility] equipment manager has been notified and will contact [place] to arrange delivery to the home.  Home address has been verified and is correct in the chart.  Reginold Agent, spouse, is the family member to contact to arrange time of delivery.  [facility]erral [facility] aware of the above.  Completed discharge summary will need to be faxed to [facility] at [phone] when final.  Please notify [facility] when patient is ready to leave the unit at discharge.  Call [phone] or [phone] after 5pm.  [facility] information and contact numbers given to patient (left at bedside per wife request) during this visit.  Above information shared with Aldona Bar, [facility].  Please call with any hospice related questions.  Thank you for this referral.  Edyth Gunnels, RN, [place] [facility] Liaison [phone]  All hospital liaisons are now on [place].

## 2016-11-16 NOTE — Progress Notes (Signed)
Nutrition Brief Note  Chart reviewed. Pt now transitioning to comfort care.  No further nutrition interventions warranted at this time.  Please re-consult as needed.   Satira Anis. Keishawn Rajewski, MS, RD LDN Inpatient Clinical Dietitian Pager [phone]

## 2016-11-16 NOTE — Care Management Note (Addendum)
Case Management Note  Patient Details  Name: Derrick [name] MRN: [id number] Date of Birth: Oct 18, 1928  Subjective/Objective: Pt is a readmit with weakness - extensive hx including lung CA  Action/Plan:  PTA from home with wife - on supplemental oxygen.  Pt very recently discharged home.  Palliative following - pt is appropriate for home with hospice - tentative discharge tomorrow 8/3.  CM spoke with wife - agency choice given and wife chose [facility].  Wife/family and friends will provide 24 hour care once discharged home.  Wife states pt already has walker, shower chair and oxygen - requests hospital bed and ambulance transport to the home.  CM contacted agency and left referral with Amber.    Expected Discharge Date:  Dec 06, 2016 Unknown              Expected Discharge Plan:  Home w Hospice Care  Discharge planning Services  CM Consult  Post Acute Care Choice:   [facility] in past) Choice offered to:  Spouse, Patient  Status of Service:  In process, will continue to follow   11/16/2016 11/16/2016 liaison Amy with [facility] accepted referral - will meet with pt/family and follow back up with CM Maryclare Labrador, RN 11/16/2016, 10:28 AM

## 2016-11-17 DIAGNOSIS — Z7189 Other specified counseling: Secondary | ICD-10-CM

## 2016-11-17 DIAGNOSIS — N10 Acute tubulo-interstitial nephritis: Secondary | ICD-10-CM

## 2016-11-17 MED ORDER — HALOPERIDOL LACTATE 5 MG/ML IJ SOLN: 0.5000 mg | INTRAMUSCULAR | Status: DC | PRN

## 2016-11-17 MED ORDER — GLYCOPYRROLATE 0.2 MG/ML IJ SOLN
0.2000 mg | INTRAMUSCULAR | Status: DC | PRN
  Filled 2016-11-17: qty 1

## 2016-11-17 MED ORDER — ACETAMINOPHEN 325 MG PO TABS: 650.0000 mg | ORAL_TABLET | Freq: Four times a day (QID) | ORAL | 0 refills | Status: AC | PRN

## 2016-11-17 MED ORDER — HALOPERIDOL LACTATE 2 MG/ML PO CONC
0.5000 mg | ORAL | Status: DC | PRN
  Filled 2016-11-17: qty 0.3

## 2016-11-17 MED ORDER — GLYCOPYRROLATE 1 MG PO TABS: 1.0000 mg | ORAL_TABLET | ORAL | 0 refills | Status: AC | PRN

## 2016-11-17 MED ORDER — MORPHINE SULFATE (CONCENTRATE) 10 MG/0.5ML PO SOLN: 5.0000 mg | Freq: Four times a day (QID) | ORAL | 0 refills | Status: AC | PRN

## 2016-11-17 MED ORDER — MORPHINE SULFATE 10 MG/5ML PO SOLN: 5.0000 mg | ORAL | Status: DC | PRN

## 2016-11-17 MED ORDER — TRAZODONE HCL 50 MG PO TABS: 25.0000 mg | ORAL_TABLET | Freq: Every evening | ORAL | 1 refills | Status: AC | PRN

## 2016-11-17 MED ORDER — HALOPERIDOL 0.5 MG PO TABS
0.5000 mg | ORAL_TABLET | ORAL | Status: DC | PRN
  Filled 2016-11-17: qty 1

## 2016-11-17 MED ORDER — GLYCOPYRROLATE 1 MG PO TABS
1.0000 mg | ORAL_TABLET | ORAL | Status: DC | PRN
  Filled 2016-11-17: qty 1

## 2016-11-17 NOTE — Progress Notes (Signed)
Report called to [place] at [facility].  All questions answered.

## 2016-11-17 NOTE — Progress Notes (Signed)
Daily Progress Note   Patient Name: Derrick [name]       Date: 11/17/2016 DOB: March 14, 1929  Age: 83 y.o. MRN#: [id number] Attending Physician: Reyne Dumas, MD Primary Care Physician: Crist Infante, MD Admit Date: 11/14/2016  Reason for Consultation/Follow-up: Establishing goals of care, Hospice Evaluation and Non pain symptom management  Subjective: Palliative team received call from patient's spouse that patient was less responsive this morning. Coughing worse, not eating, not talking.  Evaluated patient at bedside. Breathing rapidly- requested RN administer prn morphine. Patient awake, but does not answer any questions, moans.   Patient's wife states she cannot take him home. She wishes to proceed with [facility] referral as she had previously discussed. They wish to stop all antibiotics and proceed only with comfort medications.  Review of Systems  Unable to perform ROS: Acuity of condition    Length of Stay: 1  Current Medications: Scheduled Meds:  . famotidine  20 mg Oral Daily  . feeding supplement (ENSURE ENLIVE)  237 mL Oral BID BM  . ipratropium-albuterol  3 mL Nebulization TID    Continuous Infusions:   PRN Meds: acetaminophen **OR** acetaminophen, LORazepam, morphine, ondansetron **OR** ondansetron (ZOFRAN) IV, senna-docusate, traZODone  Physical Exam  Constitutional: He appears distressed.  Frail, ill appearing  Pulmonary/Chest:  labored  Neurological:  Awake, not talking, moaning  Skin: There is pallor.            Vital Signs: BP (!) 141/84 (BP Location: Left Arm)   Pulse 96   Temp 97.7 F (36.5 C) (Oral)   Resp 20   Ht 6\' 2"  (1.88 m)   Wt 72.6 kg (160 lb)   SpO2 93%   BMI 20.54 kg/m  SpO2: SpO2: 93 % O2 Device: O2 Device: Nasal Cannula O2 Flow  Rate: O2 Flow Rate (L/min): 3 L/min  Intake/output summary:  Intake/Output Summary (Last 24 hours) at 11/17/16 0915 Last data filed at 11/17/16 0242  Gross per 24 hour  Intake              270 ml  Output              425 ml  Net             -155 ml   LBM: Last BM Date: 11/15/16 Baseline Weight: Weight: 68 kg (  150 lb) Most recent weight: Weight: 72.6 kg (160 lb)       Palliative Assessment/Data: PPS: 10%      Patient Active Problem List   Diagnosis Date Noted  . Palliative care by specialist   . Adult failure to thrive   . DNR (do not resuscitate)   . UTI (urinary tract infection) 11/14/2016  . Generalized weakness 11/14/2016  . Elevated troponin 11/14/2016  . Atrial fibrillation ([place]) 11/08/2016  . Emphysema lung ([place]) 11/08/2016  . Mass of right lung 11/08/2016  . Acute respiratory failure with hypoxia ([place]) 11/08/2016  . CKD (chronic kidney disease) stage 4, GFR 15-29 ml/min (HCC) 11/08/2016  . Left ventricular diastolic dysfunction, NYHA class 2 11/08/2016  . Thrombocytopenia, idiopathic ([place]) 11/08/2016  . COPD (chronic obstructive pulmonary disease) ([place]) 11/08/2016  . Protein calorie malnutrition ([place]) 11/08/2016  . Suspected cancer of lower lobe of right lung ([place]) 02/11/2015  . Sepsis ([place]) 06/15/2013  . Atrial fibrillation with RVR ([place]) 06/13/2013  . Fever 06/11/2013  . Pyelonephritis, acute 06/11/2013  . Community acquired pneumonia 06/11/2013  . Acute encephalopathy 06/11/2013  . Renal failure, acute on chronic (HCC) 06/11/2013  . Thrombocytopenia ([place]) 06/11/2013  . History of prostate cancer 11/13/2011  . Diverticulosis 11/13/2011    Palliative Care Assessment & Plan   Patient Profile: Derrick [name] a 83 y.o.malewith medical history significant for COPD just recently discharged from hospital for exacerbation on home O2, atrial fibrillation not on anticoagulation, known spiculated right lung mass suspected to be malignancy for which patient  has deferred additional evaluation, stage IV chronic kidney disease, chronic idiopathic thrombocytopenia and prostate cancer presents emergency Department with  chief complaint generalized weakness with recent fall. Initial evaluation reveals a urinary tract infection and an elevated troponin.Patient has had continued physical and functional decline over the past few months. Patient and family face treatment option decisions, advanced directive decisions, and anticipatory care needs.Palliative medicine consulted for [name].  Assessment/Recommendations/Plan   Transition to full comfort care  D/C antibiotics 5mg  morphine concentrate SL q1hr prn SOB, air hunger Lorazepam 1mg  po, SL or IV q4 hr anxiety Haldol .5mg  po, SL or IV q4 hr PRN agitation Glycopyrrolate .2mg  IV q4hr secretions Social work referral for [place] and Additional Recommendations:  Limitations on Scope of Treatment: Full Comfort Care  Code Status:  DNR  Prognosis:   < 2 weeks d/t end stage COPD, lung malignancy, UTI, little to no PO intake, transition to full comfort measures only  Discharge Planning:  Hospice facility  Care plan was discussed with patient's spouse and Crawford Givens LCSW.  Thank you for allowing the Palliative Medicine Team to assist in the care of this patient.   Total Time: 35 minutes  Greater than 50%  of this time was spent counseling and coordinating care related to the above assessment and plan.  Mariana Kaufman, AGNP-C Palliative Medicine   Please contact Palliative Medicine Team phone at [phone] for questions and concerns.

## 2016-11-17 NOTE — Discharge Summary (Signed)
Physician Discharge Summary  Derrick [name] MRN: [id number] DOB/AGE: June 09, 1928 83 y.o.  PCP: Crist Infante, MD   Admit date: 11/14/2016 Discharge date: 11/17/2016  Discharge Diagnoses:    Principal Problem:   UTI (urinary tract infection) Active Problems:   History of prostate cancer   Atrial fibrillation (HCC)   Mass of right lung   CKD (chronic kidney disease) stage 4, GFR 15-29 ml/min (HCC)   COPD (chronic obstructive pulmonary disease) (HCC)   Generalized weakness   Elevated troponin   Palliative care by specialist   Adult failure to thrive   DNR (do not resuscitate)    Follow-up recommendations  [facility]  transfer today, Dr. Orpah Melter to assume care per family request       Current Discharge Medication List    START taking these medications   Details  acetaminophen (TYLENOL) 325 MG tablet Take 2 tablets (650 mg total) by mouth every 6 (six) hours as needed for mild pain (or Fever >/= 101). Qty: 30 tablet, Refills: 0    glycopyrrolate (ROBINUL) 1 MG tablet Take 1 tablet (1 mg total) by mouth every 4 (four) hours as needed (excessive secretions). Qty: 30 tablet, Refills: 0    Morphine Sulfate (MORPHINE CONCENTRATE) 10 MG/0.5ML SOLN concentrated solution Place 0.25 mLs (5 mg total) under the tongue every 6 (six) hours as needed for severe pain. Qty: 180 mL, Refills: 0    traZODone (DESYREL) 50 MG tablet Take 0.5 tablets (25 mg total) by mouth at bedtime as needed for sleep. Qty: 30 tablet, Refills: 1      CONTINUE these medications which have NOT CHANGED   Details  cholecalciferol (VITAMIN D) 1000 units tablet Take 1,000 Units by mouth daily.    feeding supplement, ENSURE ENLIVE, (ENSURE ENLIVE) LIQD Take 237 mLs by mouth 2 (two) times daily between meals. Qty: 30 Bottle, Refills: 0    Multiple Vitamin (MULTIVITAMIN) tablet Take 1 tablet by mouth daily.    Probiotic Product (ALIGN) 4 MG CAPS Take 4 mg by mouth daily.    ranitidine (ZANTAC) 150  MG tablet Take 150 mg by mouth daily.    vitamin B-12 (CYANOCOBALAMIN) 1000 MCG tablet Take 1,000 mcg by mouth daily.      STOP taking these medications     aspirin 81 MG tablet      pravastatin (PRAVACHOL) 20 MG tablet          Discharge Condition:       Please note  You were cared for by a hospitalist during your hospital stay. Once you are discharged, your primary care physician will handle any further medical issues. Please note that NO REFILLS for any discharge medications will be authorized once you are discharged, as it is imperative that you return to your primary care physician (or establish a relationship with a primary care physician if you do not have one) for your aftercare needs so that they can reassess your need for medications and monitor your lab values.  Discharge Instructions    Diet - low sodium heart healthy    Complete by:  As directed    Increase activity slowly    Complete by:  As directed        No Known Allergies    Disposition: [facility]    Consults: Palliative care     Significant Diagnostic Studies:  Dg Chest 2 View  Result Date: 11/14/2016 CLINICAL DATA:  83 year old male with cough for few days. Subsequent encounter. EXAM: CHEST  2 VIEW COMPARISON:  11/08/2016 chest x-ray and chest CT. FINDINGS: Right lower lobe mass noted on recent CT not as well delineated by plain film exam. No pulmonary edema or pneumothorax. Heart size within normal limits. Calcified tortuous aorta. Mild loss of height midthoracic vertebra without change. IMPRESSION: Right lower lobe mass better delineated on recent CT. Findings suspicious for malignancy. Aortic Atherosclerosis (ICD10-I70.0). Electronically Signed   By: Genia Del M.D.   On: 11/14/2016 10:08   Dg Chest 2 View  Result Date: 11/08/2016 CLINICAL DATA:  Shortness of breath, weakness EXAM: CHEST  2 VIEW COMPARISON:  06/11/2013 FINDINGS: There is hyperinflation of the lungs compatible with COPD.  Previously seen right lower lobe mass not as well visualized as on prior CT. Bibasilar atelectasis. Heart is normal size. No acute bony abnormality. IMPRESSION: COPD. Right lower lobe mass not as well visualized as on prior CT. Bibasilar atelectasis. Electronically Signed   By: Rolm Baptise M.D.   On: 11/08/2016 10:53   Ct Head Wo Contrast  Result Date: 11/14/2016 CLINICAL DATA:  Increased generalized weakness for 2 days. Fall this morning. EXAM: CT HEAD WITHOUT CONTRAST CT CERVICAL SPINE WITHOUT CONTRAST TECHNIQUE: Multidetector CT imaging of the head and cervical spine was performed following the standard protocol without intravenous contrast. Multiplanar CT image reconstructions of the cervical spine were also generated. COMPARISON:  None. FINDINGS: CT HEAD FINDINGS Brain: No evidence of acute infarction, hemorrhage, hydrocephalus, extra-axial collection or mass lesion/mass effect. Moderate age-related cerebral atrophy with compensatory dilatation of the ventricles. Periventricular white matter and corona radiata hypodensities favor chronic ischemic microvascular white matter disease. Vascular: Carotid and vertebral atherosclerotic calcification. No hyperdense vessel. Skull: Normal. Negative for fracture or focal lesion. Sinuses/Orbits: Minimal mucosal thickening in the left maxillary sinus. The remaining bilateral paranasal sinuses and mastoid air cells are clear. Bilateral pseudophakia. Other: None. CT CERVICAL SPINE FINDINGS Alignment: Trace anterolisthesis of C4 on C5 due to facet arthropathy. Skull base and vertebrae: No acute fracture. No primary bone lesion or focal pathologic process. Soft tissues and spinal canal: No prevertebral fluid or swelling. No visible canal hematoma. Atherosclerotic vascular calcifications of the bilateral carotid bulbs. Disc levels: Increased mineralization posterior to the dens, which can be age-related or seen with CPPD. Advanced degenerative disc disease and facet  uncovertebral hypertrophy from C3-C4 through C6-C7, resulting in moderate left neuroforaminal stenosis at C3-C4, C4-C5, and C5-C6. Upper chest: Biapical pleuroparenchymal scarring and paraseptal emphysema. Atherosclerotic calcification of the aortic arch. Other: None. IMPRESSION: 1.  No acute intracranial abnormality. 2.  No acute cervical spine fracture. 3.  Aortic Atherosclerosis (ICD10-I70.0). 4.  Emphysema. (UUV25-D66.9) Electronically Signed   By: Titus Dubin M.D.   On: 11/14/2016 10:23   Ct Chest Wo Contrast  Result Date: 11/08/2016 CLINICAL DATA:  History of lung mass. Worsening shortness of breath. EXAM: CT CHEST WITHOUT CONTRAST TECHNIQUE: Multidetector CT imaging of the chest was performed following the standard protocol without IV contrast. COMPARISON:  CT chest dated April 10, 2016. FINDINGS: Cardiovascular: The heart is normal in size. Coronary, aortic arch, and branch vessel atherosclerotic vascular disease. The ascending thoracic aorta stable in size, measuring 4.3 cm in diameter. Mediastinum/Nodes: Multiple subcentimeter mediastinal lymph nodes are unchanged compared to prior CT. No axillary lymphadenopathy. Moderate hiatal hernia again seen. Lungs/Pleura: The spiculated right lower lobe mass appears grossly similar in size, measuring 3.8 cm AP x 2.8 cm transverse. Similarly, the more peripheral mass like consolidation in the right lobe is also unchanged in size, measuring 3.0 x  1.5 cm. No new lung nodules or masses identified. Moderate upper lobe predominant centrilobular and paraseptal emphysema is similar to prior study. No pleural effusion or pneumothorax. Upper Abdomen: Numerous bilateral renal cysts are similar to prior study. Cholecystectomy. No acute abnormality. Musculoskeletal: Multilevel degenerative changes throughout the thoracic spine and multiple chronic compression deformities are similar to prior study. No suspicious osseous lesion. No fracture. IMPRESSION: 1. No  significant interval change in size in the spiculated right lower lobe mass and slightly more peripheral mass-like consolidation within the right lower lobe, concerning for primary lung malignancy. 2. Unchanged ascending thoracic aortic aneurysm, measuring up to 4.3 cm. Recommend annual imaging followup by CTA or MRA. This recommendation follows 2010 ACCF/AHA/AATS/ACR/ASA/SCA/SCAI/SIR/STS/SVM Guidelines for the Diagnosis and Management of Patients with Thoracic Aortic Disease. Circulation. 2010; 121: B846-K599 3.  Emphysema. (ICD10-J43.9) 4.  Aortic Atherosclerosis (ICD10-I70.0). Electronically Signed   By: Titus Dubin M.D.   On: 11/08/2016 12:51   Ct Cervical Spine Wo Contrast  Result Date: 11/14/2016 CLINICAL DATA:  Increased generalized weakness for 2 days. Fall this morning. EXAM: CT HEAD WITHOUT CONTRAST CT CERVICAL SPINE WITHOUT CONTRAST TECHNIQUE: Multidetector CT imaging of the head and cervical spine was performed following the standard protocol without intravenous contrast. Multiplanar CT image reconstructions of the cervical spine were also generated. COMPARISON:  None. FINDINGS: CT HEAD FINDINGS Brain: No evidence of acute infarction, hemorrhage, hydrocephalus, extra-axial collection or mass lesion/mass effect. Moderate age-related cerebral atrophy with compensatory dilatation of the ventricles. Periventricular white matter and corona radiata hypodensities favor chronic ischemic microvascular white matter disease. Vascular: Carotid and vertebral atherosclerotic calcification. No hyperdense vessel. Skull: Normal. Negative for fracture or focal lesion. Sinuses/Orbits: Minimal mucosal thickening in the left maxillary sinus. The remaining bilateral paranasal sinuses and mastoid air cells are clear. Bilateral pseudophakia. Other: None. CT CERVICAL SPINE FINDINGS Alignment: Trace anterolisthesis of C4 on C5 due to facet arthropathy. Skull base and vertebrae: No acute fracture. No primary bone lesion or  focal pathologic process. Soft tissues and spinal canal: No prevertebral fluid or swelling. No visible canal hematoma. Atherosclerotic vascular calcifications of the bilateral carotid bulbs. Disc levels: Increased mineralization posterior to the dens, which can be age-related or seen with CPPD. Advanced degenerative disc disease and facet uncovertebral hypertrophy from C3-C4 through C6-C7, resulting in moderate left neuroforaminal stenosis at C3-C4, C4-C5, and C5-C6. Upper chest: Biapical pleuroparenchymal scarring and paraseptal emphysema. Atherosclerotic calcification of the aortic arch. Other: None. IMPRESSION: 1.  No acute intracranial abnormality. 2.  No acute cervical spine fracture. 3.  Aortic Atherosclerosis (ICD10-I70.0). 4.  Emphysema. (JTT01-X79.9) Electronically Signed   By: Titus Dubin M.D.   On: 11/14/2016 10:23       Filed Weights   11/14/16 1955 11/15/16 2028 11/16/16 2207  Weight: 73.9 kg (163 lb) 73.9 kg (163 lb) 72.6 kg (160 lb)     Microbiology: Recent Results (from the past 240 hour(s))  Respiratory Panel by PCR     Status: None   Collection Time: 11/08/16  6:10 PM  Result Value Ref Range Status   Adenovirus NOT DETECTED NOT DETECTED Final   Coronavirus 229E NOT DETECTED NOT DETECTED Final   Coronavirus HKU1 NOT DETECTED NOT DETECTED Final   Coronavirus NL63 NOT DETECTED NOT DETECTED Final   Coronavirus OC43 NOT DETECTED NOT DETECTED Final   Metapneumovirus NOT DETECTED NOT DETECTED Final   Rhinovirus / Enterovirus NOT DETECTED NOT DETECTED Final   Influenza A NOT DETECTED NOT DETECTED Final   Influenza B NOT DETECTED NOT  DETECTED Final   Parainfluenza Virus 1 NOT DETECTED NOT DETECTED Final   Parainfluenza Virus 2 NOT DETECTED NOT DETECTED Final   Parainfluenza Virus 3 NOT DETECTED NOT DETECTED Final   Parainfluenza Virus 4 NOT DETECTED NOT DETECTED Final   Respiratory Syncytial Virus NOT DETECTED NOT DETECTED Final   Bordetella pertussis NOT DETECTED NOT  DETECTED Final   Chlamydophila pneumoniae NOT DETECTED NOT DETECTED Final   Mycoplasma pneumoniae NOT DETECTED NOT DETECTED Final  Urine culture     Status: Abnormal   Collection Time: 11/14/16 11:10 AM  Result Value Ref Range Status   Specimen Description URINE, CLEAN CATCH  Final   Special Requests NONE  Final   Culture MULTIPLE SPECIES PRESENT, SUGGEST RECOLLECTION (A)  Final   Report Status 11/15/2016 FINAL  Final  Culture, blood (routine x 2)     Status: None (Preliminary result)   Collection Time: 11/14/16  8:39 PM  Result Value Ref Range Status   Specimen Description BLOOD LEFT HAND  Final   Special Requests   Final    BOTTLES DRAWN AEROBIC AND ANAEROBIC Blood Culture adequate volume   Culture NO GROWTH 2 DAYS  Final   Report Status PENDING  Incomplete  Culture, blood (routine x 2)     Status: None (Preliminary result)   Collection Time: 11/14/16  8:44 PM  Result Value Ref Range Status   Specimen Description BLOOD LEFT ANTECUBITAL  Final   Special Requests   Final    BOTTLES DRAWN AEROBIC AND ANAEROBIC Blood Culture adequate volume   Culture NO GROWTH 2 DAYS  Final   Report Status PENDING  Incomplete       Blood Culture    Component Value Date/Time   SDES BLOOD LEFT ANTECUBITAL 11/14/2016 2044   SPECREQUEST  11/14/2016 2044    BOTTLES DRAWN AEROBIC AND ANAEROBIC Blood Culture adequate volume   CULT NO GROWTH 2 DAYS 11/14/2016 2044   REPTSTATUS PENDING 11/14/2016 2044      Labs: No results found for this or any previous visit (from the past 48 hour(s)).     HPI   83 y.o.malemedical history significant for COPD just recently discharged from hospital for exacerbation on home O2, atrial fibrillation not on anticoagulation, known spiculated right lung mass suspected to be malignancy for which patient has deferred additional evaluation, stage IV chronic kidney disease, chronic idiopathic thrombocytopenia and prostate cancer presents emergency Department chief  complaint generalized weakness with recent fall, general deconditioning and gradual approach to end of life. Pt w/ massive lung mass that is likely cancer. Pt w/ multiple recent admission and this is likely to continue. Palliative care consult greatly appreciated for [place] discussions.   HOSPITAL COURSE:    #1. Urinary tract infection.   Change rocephin to omnicef PO 3 days Urine culture nonspecific, abx dc'd     #2. Chronic kidney disease stage IV. Creatinine 3.3> 3.26  on admission. This appears to be close to his baseline. No further labs    #3. Atrial fibrillation/elevated troponin. Currently rate controlled. Not on anticoagulation. Home medications include weight control meds.Initial troponin 0.16, now 0.58.  No reports of chest pain. EKG without acute changes. Not a candidate for intervention. No further troponins    #4. COPD on home oxygen. Patient recently hospitalized with acute respiratory failure presumably from a COPD exacerbation. Has not had an official diagnosis. Chest x-ray right lower lobe mass that appears to be stable on repeat CT scan this admission Oxygen for comfort, We will  transition to hospice at home tomorrow    #5.Massive right lung mass. Patient has deferred formal workup/biopsy.  CT scan reviewed    #6. Generalized weakness/fall. Likely related to all the above. Infectious process in the setting of likely progressive disease. Discharge to [facility]       Discharge Exam:   Blood pressure 120/61, pulse 79, temperature 97.9 F (36.6 C), temperature source Oral, resp. rate 18, height 6\' 2"  (1.88 m), weight 72.6 kg (160 lb), SpO2 94 %.  Cardiovascular system: S1 & S2 heard, RRR. No JVD, murmurs, rubs, gallops or clicks. No pedal edema. Gastrointestinal system: Abdomen is nondistended, soft and nontender. No organomegaly or masses felt. Normal bowel sounds heard. Central nervous system: Alert and oriented to family . No focal neurological  deficits. Extremities: Symmetric 5 x 5 power. Skin: No rashes, lesions or ulcers Psychiatry: Judgement and insight appear impaired . Mood & affect appropriate.      SignedReyne Dumas 11/17/2016, 12:23 PM        Time spent >1 hour

## 2016-11-17 NOTE — Care Management Note (Signed)
Case Management Note  Patient Details  Name: Frazer [place] Gaylord MRN: [id number] Date of Birth: 1929-03-23  Subjective/Objective:                 Verified w Lorriane Shire CSW that patient will DC to residential hospice. Left HIPPA compliant message w Amy, [facility], for callback. Will notify patient no longer needs home hospice.    Action/Plan:  CSW to facilitate DC to [place].  Expected Discharge Date:  November 28, 2016               Expected Discharge Plan:  [place]  In-House Referral:     Discharge planning Services  CM Consult  Post Acute Care Choice:   [facility] in past) Choice offered to:  Spouse, Patient  DME Arranged:    DME Agency:     HH Arranged:    [place] Agency:     Status of Service:  Completed, signed off  If discussed at [employer], dates discussed:    Additional Comments:  Carles Collet, RN 11/17/2016, 10:33 AM

## 2016-11-17 NOTE — Progress Notes (Signed)
AVS printed and given to wife.

## 2016-11-17 NOTE — Clinical Social Work Note (Signed)
Patient discharging to [facility] today, transported by ambulance. Admissions paperwork completed and d/c summary transmitted to facility. Transport has been called. CSW signing off as patient d/c'd to [facility] for end of life care.  Amarachi Kotz Givens, MSW, LCSW Licensed Clinical Social Worker [place] [phone]

## 2016-11-17 NOTE — Progress Notes (Signed)
[facility] Liaison: RN visit     Received request from [place], [place]  for family interest in [place] with request for transfer today. Chart reviewed. Met with wife, Reginold Agent  to confirm interest and explain services. Family agreeable to transfer today. CSW aware. Registration paper work completed.  Dr. Orpah Melter to assume care per family request. Family prefers hospice physician for end of life care.  Please fax discharge summary to [phone]. RN please call report to [phone]. Please arrange transport for patient to arrive before noon if possible.   Spoke with [facility] to discontinue order for equipment to be delivered to home.    Thank you.   [facility] Liaison  [phone]     All hospital liaison are now on [place].

## 2016-11-19 LAB — CULTURE, BLOOD (ROUTINE X 2)
CULTURE: NO GROWTH
Culture: NO GROWTH
SPECIAL REQUESTS: ADEQUATE
Special Requests: ADEQUATE

## 2016-12-16 DEATH — deceased

## 2017-03-29 DIAGNOSIS — Z7189 Other specified counseling: Secondary | ICD-10-CM

## 2019-05-21 IMAGING — CR DG CHEST 2V
2 series · 2 of 2 positions shown · non-contrast
Comparison: 06/11/2013

CLINICAL DATA: Shortness of breath, weakness

EXAM:
CHEST  2 VIEW

[chest lat]
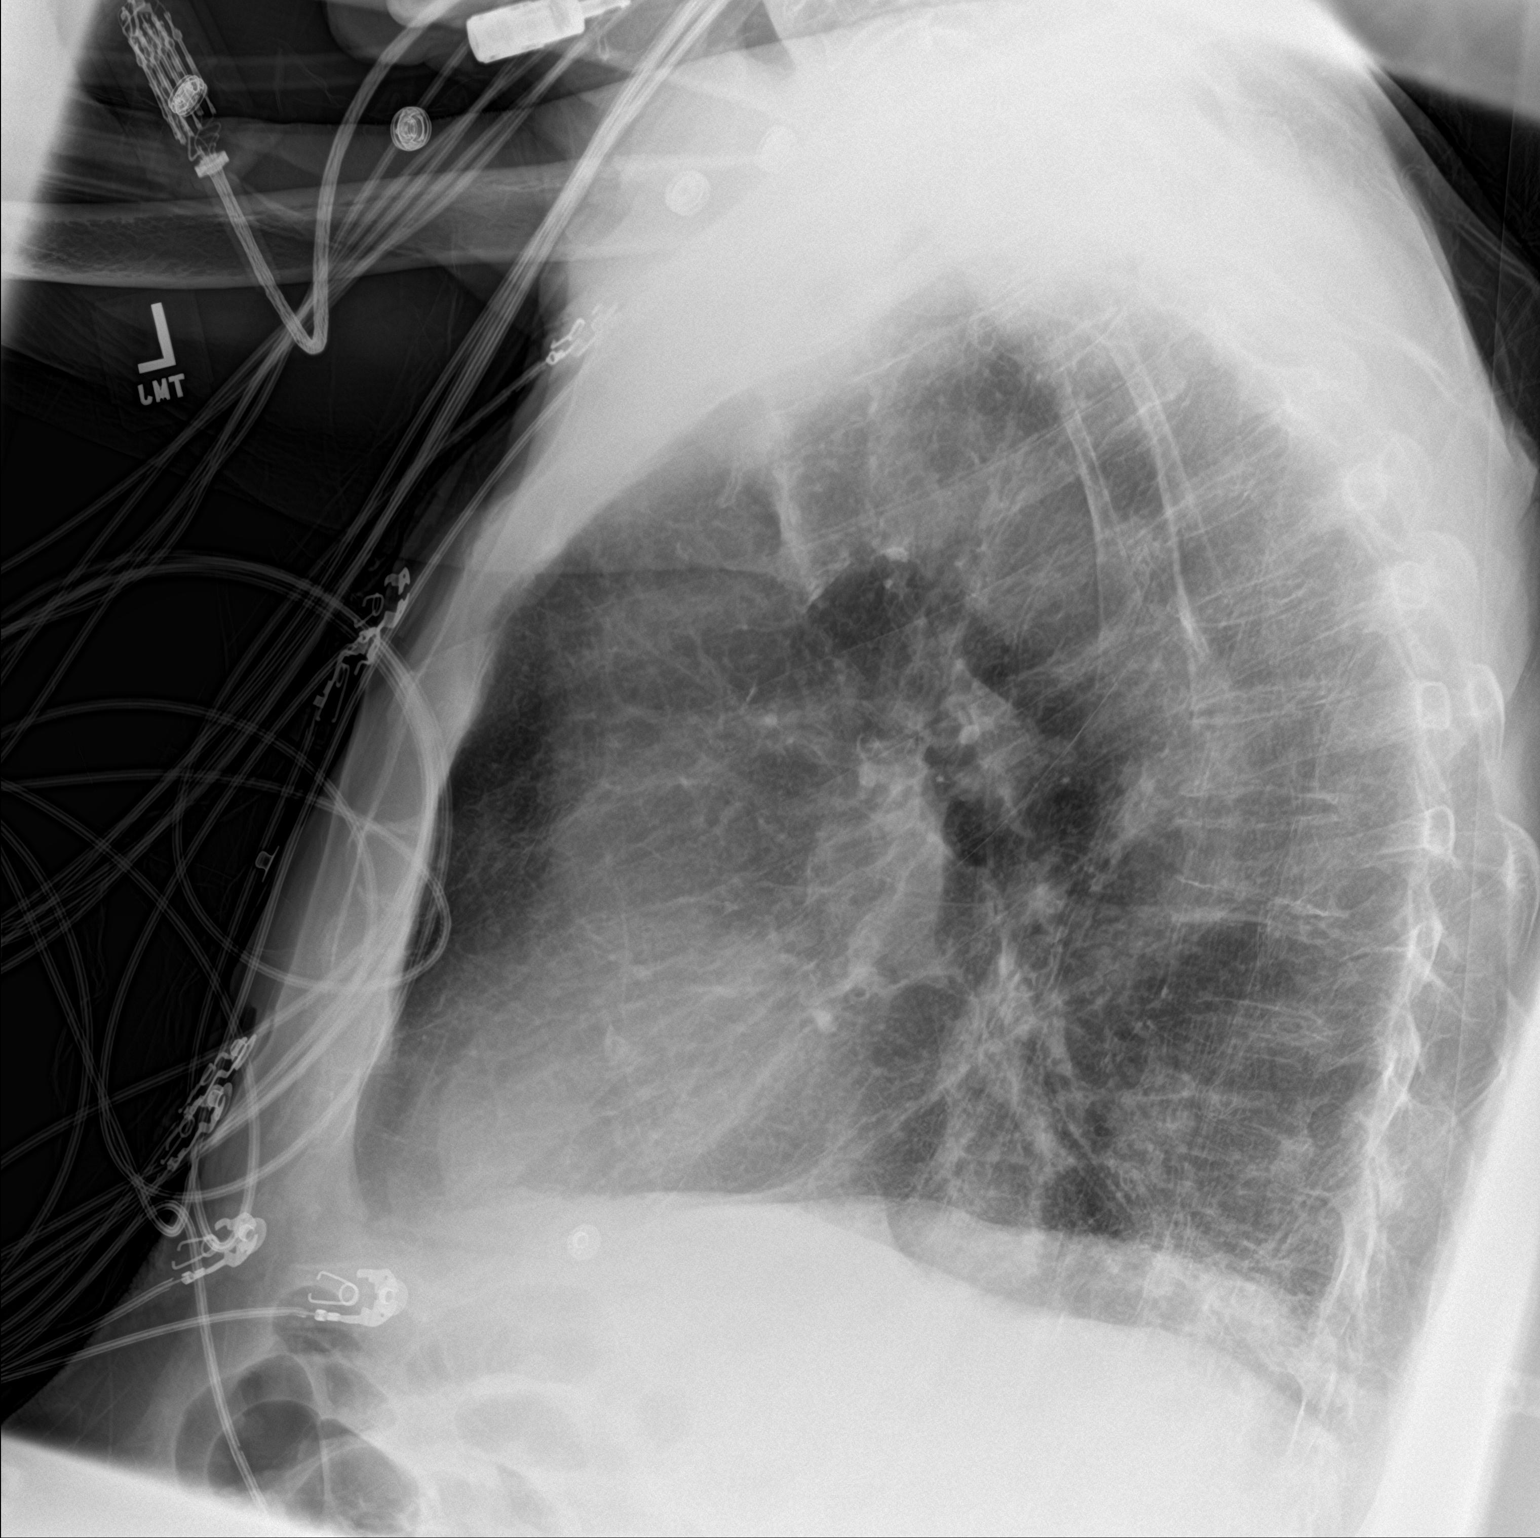

[chest ap]
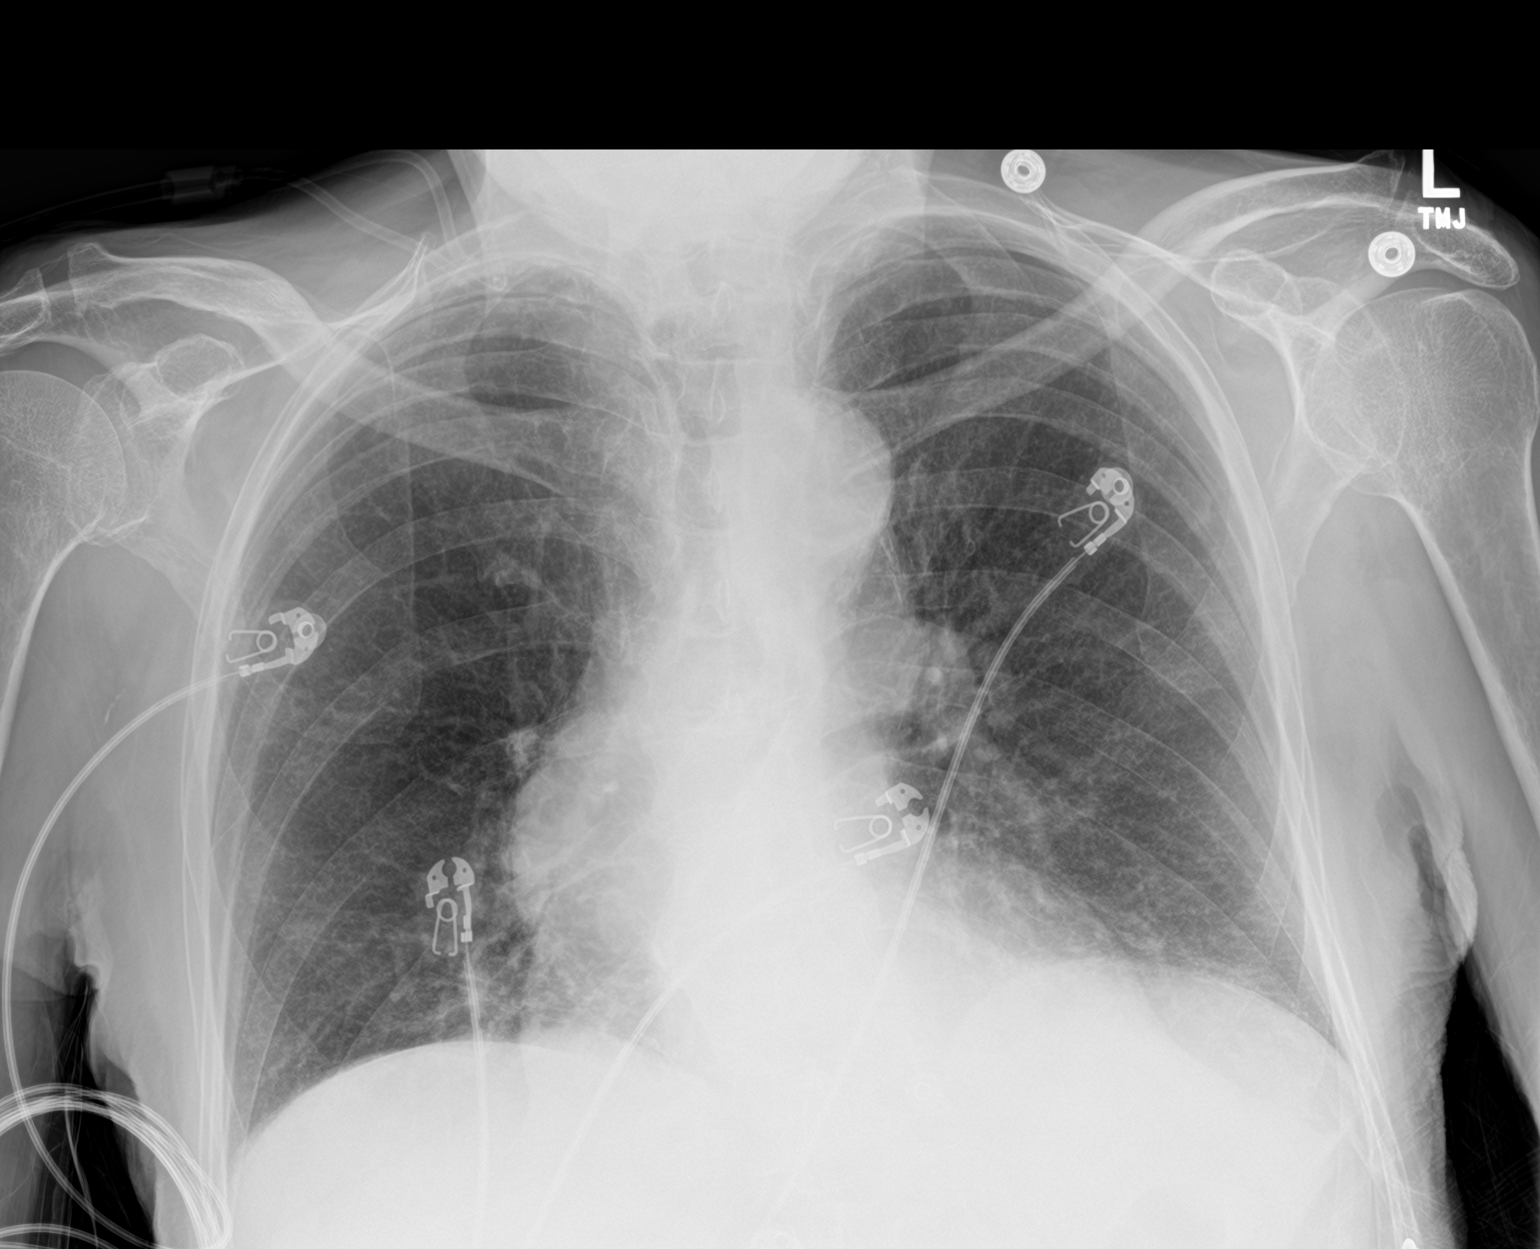

[2 of 2 positions shown; findings below may reference images not displayed]

FINDINGS: There is hyperinflation of the lungs compatible with COPD.
Previously seen right lower lobe mass not as well visualized as on
prior CT. Bibasilar atelectasis. Heart is normal size. No acute bony
abnormality.
IMPRESSION: COPD. Right lower lobe mass not as well visualized as on prior CT.
Bibasilar atelectasis.
